# Patient Record
Sex: Female | Born: 1955 | Race: White | Hispanic: No | Marital: Married | Smoking: Never smoker
Health system: Southern US, Community
[De-identification: ages and names within clinical notes are randomized; demographics above are authoritative.]

## PROBLEM LIST (undated history)

## (undated) DIAGNOSIS — C801 Malignant (primary) neoplasm, unspecified: Secondary | ICD-10-CM

## (undated) DIAGNOSIS — K219 Gastro-esophageal reflux disease without esophagitis: Secondary | ICD-10-CM

## (undated) DIAGNOSIS — K635 Polyp of colon: Secondary | ICD-10-CM

## (undated) DIAGNOSIS — M2550 Pain in unspecified joint: Secondary | ICD-10-CM

## (undated) DIAGNOSIS — J329 Chronic sinusitis, unspecified: Secondary | ICD-10-CM

## (undated) DIAGNOSIS — F32A Depression, unspecified: Secondary | ICD-10-CM

## (undated) DIAGNOSIS — E785 Hyperlipidemia, unspecified: Secondary | ICD-10-CM

## (undated) DIAGNOSIS — F329 Major depressive disorder, single episode, unspecified: Secondary | ICD-10-CM

## (undated) DIAGNOSIS — I10 Essential (primary) hypertension: Secondary | ICD-10-CM

## (undated) DIAGNOSIS — F419 Anxiety disorder, unspecified: Secondary | ICD-10-CM

## (undated) DIAGNOSIS — C50919 Malignant neoplasm of unspecified site of unspecified female breast: Secondary | ICD-10-CM

## (undated) DIAGNOSIS — M1990 Unspecified osteoarthritis, unspecified site: Secondary | ICD-10-CM

## (undated) DIAGNOSIS — H8110 Benign paroxysmal vertigo, unspecified ear: Secondary | ICD-10-CM

## (undated) DIAGNOSIS — D131 Benign neoplasm of stomach: Secondary | ICD-10-CM

## (undated) DIAGNOSIS — R319 Hematuria, unspecified: Secondary | ICD-10-CM

## (undated) HISTORY — DX: Hematuria, unspecified: R31.9

## (undated) HISTORY — DX: Depression, unspecified: F32.A

## (undated) HISTORY — DX: Pain in unspecified joint: M25.50

## (undated) HISTORY — DX: Gastro-esophageal reflux disease without esophagitis: K21.9

## (undated) HISTORY — DX: Hyperlipidemia, unspecified: E78.5

## (undated) HISTORY — PX: EYE SURGERY: SHX253

## (undated) HISTORY — DX: Malignant (primary) neoplasm, unspecified: C80.1

## (undated) HISTORY — PX: CERVICAL POLYPECTOMY: SHX88

## (undated) HISTORY — DX: Anxiety disorder, unspecified: F41.9

## (undated) HISTORY — DX: Major depressive disorder, single episode, unspecified: F32.9

## (undated) HISTORY — PX: DILATION AND CURETTAGE OF UTERUS: SHX78

---

## 1997-06-06 HISTORY — PX: OTHER SURGICAL HISTORY: SHX169

## 2006-06-06 HISTORY — PX: ESOPHAGEAL DILATION: SHX303

## 2006-06-06 HISTORY — PX: ABLATION: SHX5711

## 2007-06-07 HISTORY — PX: FRONTAL SINUSOTOMY: SUR1296

## 2007-06-07 HISTORY — PX: CARPAL TUNNEL RELEASE: SHX101

## 2011-04-24 ENCOUNTER — Ambulatory Visit: Payer: Self-pay

## 2011-05-18 ENCOUNTER — Ambulatory Visit: Payer: Self-pay | Admitting: Internal Medicine

## 2012-01-09 ENCOUNTER — Ambulatory Visit: Payer: Self-pay | Admitting: Gastroenterology

## 2012-02-24 ENCOUNTER — Ambulatory Visit: Payer: Self-pay | Admitting: Gastroenterology

## 2012-02-27 LAB — PATHOLOGY REPORT

## 2012-06-06 HISTORY — PX: CATARACT EXTRACTION: SUR2

## 2012-06-06 HISTORY — PX: OTHER SURGICAL HISTORY: SHX169

## 2012-12-19 ENCOUNTER — Ambulatory Visit: Payer: Self-pay | Admitting: Ophthalmology

## 2013-01-23 ENCOUNTER — Ambulatory Visit: Payer: Self-pay | Admitting: Ophthalmology

## 2013-04-10 ENCOUNTER — Ambulatory Visit: Payer: Self-pay | Admitting: Internal Medicine

## 2013-09-16 ENCOUNTER — Ambulatory Visit: Payer: Self-pay | Admitting: Internal Medicine

## 2014-04-16 ENCOUNTER — Ambulatory Visit: Payer: Self-pay | Admitting: Internal Medicine

## 2014-05-02 ENCOUNTER — Emergency Department: Payer: Self-pay | Admitting: Emergency Medicine

## 2014-06-09 ENCOUNTER — Ambulatory Visit: Payer: Self-pay | Admitting: Family Medicine

## 2014-07-28 ENCOUNTER — Ambulatory Visit: Payer: Self-pay | Admitting: Family Medicine

## 2014-08-04 DIAGNOSIS — R739 Hyperglycemia, unspecified: Secondary | ICD-10-CM | POA: Insufficient documentation

## 2014-12-16 ENCOUNTER — Other Ambulatory Visit: Payer: Self-pay | Admitting: Family Medicine

## 2014-12-16 DIAGNOSIS — R3129 Other microscopic hematuria: Secondary | ICD-10-CM

## 2014-12-25 ENCOUNTER — Ambulatory Visit
Admission: RE | Admit: 2014-12-25 | Discharge: 2014-12-25 | Disposition: A | Discharge status: Home / Self Care | Payer: Managed Care, Other (non HMO) | Source: Ambulatory Visit | Attending: Family Medicine | Admitting: Family Medicine

## 2014-12-25 DIAGNOSIS — R312 Other microscopic hematuria: Secondary | ICD-10-CM | POA: Diagnosis not present

## 2014-12-25 DIAGNOSIS — R3129 Other microscopic hematuria: Secondary | ICD-10-CM

## 2014-12-25 DIAGNOSIS — D259 Leiomyoma of uterus, unspecified: Secondary | ICD-10-CM | POA: Insufficient documentation

## 2014-12-25 DIAGNOSIS — Z975 Presence of (intrauterine) contraceptive device: Secondary | ICD-10-CM | POA: Insufficient documentation

## 2014-12-25 HISTORY — DX: Essential (primary) hypertension: I10

## 2014-12-25 MED ORDER — IOHEXOL 300 MG/ML  SOLN
125.0000 mL | Freq: Once | INTRAMUSCULAR | Status: AC | PRN
  Administered 2014-12-25: 125 mL via INTRAVENOUS

## 2015-01-02 ENCOUNTER — Ambulatory Visit (INDEPENDENT_AMBULATORY_CARE_PROVIDER_SITE_OTHER): Payer: Managed Care, Other (non HMO) | Admitting: Urology

## 2015-01-02 VITALS — BP 149/81 | HR 86 | Temp 98.5°F | Resp 16 | Ht 64.0 in | Wt 275.0 lb

## 2015-01-02 DIAGNOSIS — R312 Other microscopic hematuria: Secondary | ICD-10-CM

## 2015-01-02 DIAGNOSIS — R3129 Other microscopic hematuria: Secondary | ICD-10-CM | POA: Insufficient documentation

## 2015-01-02 NOTE — Progress Notes (Signed)
H&P  Chief Complaint: Microscopic hematuria  History of Present Illness: 59 year old female referred for microscopic hematuria. Urinalysis revealed 8 red blood cells per high-powered field culture 1-10,000 strep. She denies any history of gross hematuria dysuria or UTI. She has no lower urinary tract symptoms. She has no exposure risk.  She has a history of hypertension, hyperlipidemia with borderline diabetes and chronic kidney disease.   Past Medical History  Diagnosis Date  . Hypertension   . GERD (gastroesophageal reflux disease)   . Hematuria   . Hyperlipidemia   . Depression     anxiety  . Anxiety   . Arthralgia   . Cancer     basal cell   Past Surgical History  Procedure Laterality Date  . Cataract extraction Bilateral 2014  . Ablation  2008  . Carpal tunnel release  2009  . Cervical polypectomy      Home Medications:   (Not in a hospital admission) Allergies:  Allergies  Allergen Reactions  . Penicillins Anaphylaxis  . Amlodipine Other (See Comments)    Stiff joints  . Lisinopril Other (See Comments)    Stiff joints  . Sulfa Antibiotics Other (See Comments)    Hypotention  . Valsartan Other (See Comments)    Stiff joints    No family history on file. Social History:  reports that she has never smoked. She has never used smokeless tobacco. She reports that she drinks alcohol. She reports that she does not use illicit drugs.  ROS: A complete review of systems was performed.  All systems are negative except for pertinent findings as noted. ROS positive for heartburn, fever, night sweats, fatigue, sinus problems, easy bruising, leg swelling, polydipsia, joint pain, anxiety  Physical Exam:  Vital signs in last 24 hours: @VSRANGES @ General:  Alert and oriented, No acute distress HEENT: Normocephalic, atraumatic Neck: No JVD or lymphadenopathy Cardiovascular: Regular rate and rhythm Lungs: Regular rate and effort Abdomen: Soft, nontender, nondistended,  no abdominal masses Back: No CVA tenderness Extremities: No edema Neurologic: Grossly intact  Laboratory Data:  No results found for this or any previous visit (from the past 24 hour(s)). No results found for this or any previous visit (from the past 240 hour(s)). Creatinine: No results for input(s): CREATININE in the last 168 hours.  Impression/Assessment:  MH - discussed benign versus malignant etiologies but in most cases benign. I did recommend complete evaluation with pelvic exam and cystoscopy. We discussed the nature risk and benefits of this procedure. She elects to proceed. We discussed after she has a low risk of cystoscopy being benign but the risk of significant pain, infection or bleeding with a cystoscopy is minimal but the benefit can be significant if a lesion is found.  Renal cyst-1 benign cyst,1 lesion too small to characterize. Consider renal ultrasound in 6-12 months.  Plan:  -Pelvic exam, cystoscopy  -consider renal u/s in 6 - 12 months    Carrie Whitney 01/02/2015, 1:48 PM

## 2015-01-03 LAB — URINALYSIS, COMPLETE
Bilirubin, UA: NEGATIVE
Glucose, UA: NEGATIVE
KETONES UA: NEGATIVE
LEUKOCYTES UA: NEGATIVE
Nitrite, UA: NEGATIVE
Protein, UA: NEGATIVE
RBC UA: NEGATIVE
Specific Gravity, UA: 1.01 (ref 1.005–1.030)
Urobilinogen, Ur: 0.2 mg/dL (ref 0.2–1.0)
pH, UA: 5.5 (ref 5.0–7.5)

## 2015-01-03 LAB — MICROSCOPIC EXAMINATION: Renal Epithel, UA: NONE SEEN /hpf

## 2015-01-05 LAB — CULTURE, URINE COMPREHENSIVE

## 2015-02-02 ENCOUNTER — Other Ambulatory Visit: Payer: Managed Care, Other (non HMO)

## 2015-02-17 ENCOUNTER — Ambulatory Visit (INDEPENDENT_AMBULATORY_CARE_PROVIDER_SITE_OTHER): Payer: Managed Care, Other (non HMO) | Admitting: Urology

## 2015-02-17 VITALS — BP 123/81 | HR 108 | Ht 64.0 in | Wt 241.2 lb

## 2015-02-17 DIAGNOSIS — R3129 Other microscopic hematuria: Secondary | ICD-10-CM

## 2015-02-17 DIAGNOSIS — F419 Anxiety disorder, unspecified: Secondary | ICD-10-CM

## 2015-02-17 DIAGNOSIS — H8110 Benign paroxysmal vertigo, unspecified ear: Secondary | ICD-10-CM | POA: Insufficient documentation

## 2015-02-17 DIAGNOSIS — K219 Gastro-esophageal reflux disease without esophagitis: Secondary | ICD-10-CM | POA: Insufficient documentation

## 2015-02-17 DIAGNOSIS — J329 Chronic sinusitis, unspecified: Secondary | ICD-10-CM | POA: Insufficient documentation

## 2015-02-17 DIAGNOSIS — K317 Polyp of stomach and duodenum: Secondary | ICD-10-CM | POA: Insufficient documentation

## 2015-02-17 DIAGNOSIS — E785 Hyperlipidemia, unspecified: Secondary | ICD-10-CM | POA: Insufficient documentation

## 2015-02-17 DIAGNOSIS — R312 Other microscopic hematuria: Secondary | ICD-10-CM | POA: Diagnosis not present

## 2015-02-17 DIAGNOSIS — Z85828 Personal history of other malignant neoplasm of skin: Secondary | ICD-10-CM | POA: Insufficient documentation

## 2015-02-17 DIAGNOSIS — F32A Depression, unspecified: Secondary | ICD-10-CM | POA: Insufficient documentation

## 2015-02-17 DIAGNOSIS — I10 Essential (primary) hypertension: Secondary | ICD-10-CM | POA: Insufficient documentation

## 2015-02-17 DIAGNOSIS — F329 Major depressive disorder, single episode, unspecified: Secondary | ICD-10-CM | POA: Insufficient documentation

## 2015-02-17 LAB — URINALYSIS, COMPLETE
Bilirubin, UA: NEGATIVE
Glucose, UA: NEGATIVE
Ketones, UA: NEGATIVE
Leukocytes, UA: NEGATIVE
NITRITE UA: NEGATIVE
PH UA: 5.5 (ref 5.0–7.5)
Specific Gravity, UA: 1.025 (ref 1.005–1.030)
UUROB: 0.2 mg/dL (ref 0.2–1.0)

## 2015-02-17 LAB — MICROSCOPIC EXAMINATION

## 2015-02-17 MED ORDER — CIPROFLOXACIN HCL 500 MG PO TABS
500.0000 mg | ORAL_TABLET | Freq: Once | ORAL | Status: AC
  Administered 2015-02-17: 500 mg via ORAL

## 2015-02-17 MED ORDER — LIDOCAINE HCL 2 % EX GEL
1.0000 "application " | Freq: Once | CUTANEOUS | Status: AC
  Administered 2015-02-17: 1 via URETHRAL

## 2015-02-17 NOTE — Progress Notes (Signed)
    Cystoscopy Procedure Note  Patient identification was confirmed, informed consent was obtained, and patient was prepped using Betadine solution.  Lidocaine jelly was administered per urethral meatus.    Preoperative abx where received prior to procedure.    Procedure: - Flexible cystoscope introduced, without any difficulty.   - Thorough search of the bladder revealed:    normal urethral meatus    normal urothelium    no stones    no ulcers     no tumors    no urethral polyps    no trabeculation  - Ureteral orifices were normal in position and appearance.  Post-Procedure: - Patient tolerated the procedure well   

## 2015-02-25 ENCOUNTER — Other Ambulatory Visit: Payer: Self-pay | Admitting: Gastroenterology

## 2015-02-25 DIAGNOSIS — R1310 Dysphagia, unspecified: Secondary | ICD-10-CM

## 2015-03-02 ENCOUNTER — Ambulatory Visit
Admission: RE | Admit: 2015-03-02 | Discharge: 2015-03-02 | Disposition: A | Discharge status: Home / Self Care | Payer: Managed Care, Other (non HMO) | Source: Ambulatory Visit | Attending: Gastroenterology | Admitting: Gastroenterology

## 2015-03-02 DIAGNOSIS — R1310 Dysphagia, unspecified: Secondary | ICD-10-CM | POA: Diagnosis present

## 2015-03-02 DIAGNOSIS — K219 Gastro-esophageal reflux disease without esophagitis: Secondary | ICD-10-CM | POA: Insufficient documentation

## 2015-04-16 ENCOUNTER — Encounter: Payer: Self-pay | Admitting: *Deleted

## 2015-04-17 ENCOUNTER — Ambulatory Visit: Payer: Managed Care, Other (non HMO) | Admitting: Anesthesiology

## 2015-04-17 ENCOUNTER — Ambulatory Visit
Admission: RE | Admit: 2015-04-17 | Discharge: 2015-04-17 | Disposition: A | Discharge status: Home / Self Care | Payer: Managed Care, Other (non HMO) | Source: Ambulatory Visit | Attending: Gastroenterology | Admitting: Gastroenterology

## 2015-04-17 ENCOUNTER — Encounter
Admission: RE | Disposition: A | Discharge status: Home / Self Care | Payer: Self-pay | Source: Ambulatory Visit | Attending: Gastroenterology

## 2015-04-17 DIAGNOSIS — M2550 Pain in unspecified joint: Secondary | ICD-10-CM | POA: Diagnosis not present

## 2015-04-17 DIAGNOSIS — H8110 Benign paroxysmal vertigo, unspecified ear: Secondary | ICD-10-CM | POA: Diagnosis not present

## 2015-04-17 DIAGNOSIS — K219 Gastro-esophageal reflux disease without esophagitis: Secondary | ICD-10-CM | POA: Diagnosis not present

## 2015-04-17 DIAGNOSIS — Z880 Allergy status to penicillin: Secondary | ICD-10-CM | POA: Insufficient documentation

## 2015-04-17 DIAGNOSIS — Z6841 Body Mass Index (BMI) 40.0 and over, adult: Secondary | ICD-10-CM | POA: Insufficient documentation

## 2015-04-17 DIAGNOSIS — K449 Diaphragmatic hernia without obstruction or gangrene: Secondary | ICD-10-CM | POA: Insufficient documentation

## 2015-04-17 DIAGNOSIS — E785 Hyperlipidemia, unspecified: Secondary | ICD-10-CM | POA: Diagnosis not present

## 2015-04-17 DIAGNOSIS — I10 Essential (primary) hypertension: Secondary | ICD-10-CM | POA: Insufficient documentation

## 2015-04-17 DIAGNOSIS — R319 Hematuria, unspecified: Secondary | ICD-10-CM | POA: Insufficient documentation

## 2015-04-17 DIAGNOSIS — Z888 Allergy status to other drugs, medicaments and biological substances status: Secondary | ICD-10-CM | POA: Diagnosis not present

## 2015-04-17 DIAGNOSIS — K317 Polyp of stomach and duodenum: Secondary | ICD-10-CM | POA: Insufficient documentation

## 2015-04-17 DIAGNOSIS — D131 Benign neoplasm of stomach: Secondary | ICD-10-CM

## 2015-04-17 DIAGNOSIS — R1310 Dysphagia, unspecified: Secondary | ICD-10-CM | POA: Diagnosis present

## 2015-04-17 DIAGNOSIS — Z882 Allergy status to sulfonamides status: Secondary | ICD-10-CM | POA: Diagnosis not present

## 2015-04-17 DIAGNOSIS — F329 Major depressive disorder, single episode, unspecified: Secondary | ICD-10-CM | POA: Diagnosis not present

## 2015-04-17 DIAGNOSIS — J329 Chronic sinusitis, unspecified: Secondary | ICD-10-CM | POA: Insufficient documentation

## 2015-04-17 DIAGNOSIS — Z85828 Personal history of other malignant neoplasm of skin: Secondary | ICD-10-CM | POA: Diagnosis not present

## 2015-04-17 DIAGNOSIS — R1013 Epigastric pain: Secondary | ICD-10-CM | POA: Insufficient documentation

## 2015-04-17 DIAGNOSIS — F419 Anxiety disorder, unspecified: Secondary | ICD-10-CM | POA: Diagnosis not present

## 2015-04-17 DIAGNOSIS — Z79899 Other long term (current) drug therapy: Secondary | ICD-10-CM | POA: Insufficient documentation

## 2015-04-17 DIAGNOSIS — K2970 Gastritis, unspecified, without bleeding: Secondary | ICD-10-CM | POA: Insufficient documentation

## 2015-04-17 HISTORY — DX: Benign paroxysmal vertigo, unspecified ear: H81.10

## 2015-04-17 HISTORY — DX: Unspecified osteoarthritis, unspecified site: M19.90

## 2015-04-17 HISTORY — DX: Benign neoplasm of stomach: D13.1

## 2015-04-17 HISTORY — DX: Chronic sinusitis, unspecified: J32.9

## 2015-04-17 HISTORY — PX: ESOPHAGOGASTRODUODENOSCOPY (EGD) WITH PROPOFOL: SHX5813

## 2015-04-17 SURGERY — ESOPHAGOGASTRODUODENOSCOPY (EGD) WITH PROPOFOL
Anesthesia: General

## 2015-04-17 MED ORDER — PROPOFOL 10 MG/ML IV BOLUS
INTRAVENOUS | Status: DC | PRN
  Administered 2015-04-17 (×2): 50 mg via INTRAVENOUS

## 2015-04-17 MED ORDER — PROPOFOL 500 MG/50ML IV EMUL
INTRAVENOUS | Status: DC | PRN
  Administered 2015-04-17: 120 ug/kg/min via INTRAVENOUS

## 2015-04-17 MED ORDER — SODIUM CHLORIDE 0.9 % IV SOLN
INTRAVENOUS | Status: DC
  Administered 2015-04-17: 08:00:00 via INTRAVENOUS

## 2015-04-17 NOTE — H&P (Signed)
Outpatient short stay form Pre-procedure 04/17/2015 8:17 AM Carrie Sails MD  Primary Physician: Dr Blenda Mounts  Reason for visit:  EGD  History of present illness:  Patient is a 59 year old female presenting today with complaints of increasing reflux and dyspepsia. Some occasional dysphagia barium swallow study showed no evidence of a fixed stricture. There is evidence of some mild dysmotility. Recently had a prescription refill with a different patient and feels this is not working as well as the previous. She takes no aspirin or blood thinning medications.    Current facility-administered medications:  .  0.9 %  sodium chloride infusion, , Intravenous, Continuous, Carrie Sails, MD, Last Rate: 20 mL/hr at 04/17/15 0751 .  0.9 %  sodium chloride infusion, , Intravenous, Continuous, Carrie Sails, MD  Prescriptions prior to admission  Medication Sig Dispense Refill Last Dose  . nebivolol (BYSTOLIC) 5 MG tablet Take 5 mg by mouth.   04/17/2015 at 0600  . atorvastatin (LIPITOR) 40 MG tablet Take by mouth.   Completed Course at Unknown time  . lansoprazole (PREVACID) 30 MG capsule Take 30 mg by mouth.   Taking  . levonorgestrel (MIRENA) 20 MCG/24HR IUD by Intrauterine route.   Completed Course at Unknown time  . venlafaxine XR (EFFEXOR XR) 150 MG 24 hr capsule Take by mouth.   Completed Course at Unknown time     Allergies  Allergen Reactions  . Penicillins Anaphylaxis  . Amlodipine Other (See Comments)    Stiff joints  . Lisinopril Other (See Comments)    Stiff joints  . Sulfa Antibiotics Other (See Comments)    Hypotention  . Valsartan Other (See Comments)    Stiff joints     Past Medical History  Diagnosis Date  . Hypertension   . GERD (gastroesophageal reflux disease)   . Hematuria   . Hyperlipidemia   . Depression     anxiety  . Anxiety   . Arthralgia   . Cancer ([place])     basal cell  . Arthritis   . BPPV (benign paroxysmal positional vertigo)    . Sinusitis, chronic     Review of systems:      Physical Exam    Heart and lungs: Regular rate and rhythm without rub or gallop, lungs are bilaterally clear    HEENT: Normocephalic atraumatic eyes are anicteric    Other:     Pertinant exam for procedure: Soft nontender nondistended bowel sounds positive normoactive    Planned proceedures: EGD and indicated procedures I have discussed the risks benefits and complications of procedures to include not limited to bleeding, infection, perforation and the risk of sedation and the patient wishes to proceed.    Carrie Sails, MD Gastroenterology 04/17/2015  8:17 AM

## 2015-04-17 NOTE — Transfer of Care (Signed)
Immediate Anesthesia Transfer of Care Note  Patient: Carrie Whitney  Procedure(s) Performed: Procedure(s): ESOPHAGOGASTRODUODENOSCOPY (EGD) WITH PROPOFOL (N/A)  Patient Location: PACU  Anesthesia Type:General  Level of Consciousness: awake, alert  and oriented  Airway & Oxygen Therapy: Patient Spontanous Breathing and Patient connected to nasal cannula oxygen  Post-op Assessment: Report given to RN and Post -op Vital signs reviewed and stable  Post vital signs: stable  Last Vitals:  Filed Vitals:   04/17/15 0746  BP: 148/75  Pulse: 64  Temp: 37.6 C  Resp: 21    Complications: No apparent anesthesia complications

## 2015-04-17 NOTE — Op Note (Signed)
[facility] Gastroenterology Patient Name: Carrie Whitney Procedure Date: 04/17/2015 7:54 AM MRN: [id number] Account #: [id number] Date of Birth: [birth year] Admit Type: Outpatient Age: 59 Room: [facility] ENDO ROOM 3 Gender: Female Note Status: Finalized Procedure:         Upper GI endoscopy Indications:       Dyspepsia, Dysphagia, Follow-up of gastric polyps Providers:         Lollie Sails, MD Medicines:         Monitored Anesthesia Care Complications:     No immediate complications. Procedure:         Pre-Anesthesia Assessment:                    - ASA Grade Assessment: III - A patient with severe                     systemic disease.                    After obtaining informed consent, the endoscope was passed                     under direct vision. Throughout the procedure, the                     patient's blood pressure, pulse, and oxygen saturations                     were monitored continuously. The Endoscope was introduced                     through the mouth, and advanced to the third part of                     duodenum. The upper GI endoscopy was unusually difficult.                     Successful completion of the procedure was aided by                     changing the patient's position. The patient tolerated the                     procedure well. Findings:      The Z-line was variable. Biopsies were taken with a cold forceps for       histology.      The exam of the esophagus was otherwise normal.      Multiple 1 to 5 mm sessile polyps with no bleeding and no stigmata of       recent bleeding were found in the cardia, in the gastric body and in the       entire examined stomach. Biopsies were taken with a cold forceps for       histology.      Patchy minimal inflammation characterized by erythema was found in the       gastric body. Biopsies were taken with a cold forceps for histology.       Biopsies were taken with a cold forceps for  Helicobacter pylori testing.      A single 5 mm pedunculated polyp with no bleeding and stigmata of recent       bleeding/erosion was found in the cardia. +I attenpted to approach this       polyp multiple timed from both a  retroflexed and forward view and was       unsuccessfur in gettting access for removal. I was able to get a single       biopsy in forward vieer with some difficulty again in alignment.       hemostasis was good.      A small hiatus hernia was present. When she hiccoughed, the polyp in the       cardia would come above the diaphragmatic hiatus only briefly.      The examined duodenum was normal. Impression:        - Z-line variable. Biopsied.                    - Multiple gastric polyps. Biopsied.                    - Gastritis. Biopsied.                    - A single gastric polyp.                    - Small hiatus hernia.                    - Normal examined duodenum. Recommendation:    - Await pathology results.                    - Use Protonix (pantoprazole) 40 mg PO BID for 4 weeks.                    - Use Protonix (pantoprazole) 40 mg PO daily daily. Procedure Code(s): --- Professional ---                    [phone], Esophagogastroduodenoscopy, flexible, transoral;                     with biopsy, single or multiple CPT copyright 2014 American Medical Association. All rights reserved. The codes documented in this report are preliminary and upon coder review may  be revised to meet current compliance requirements. Lollie Sails, MD 04/17/2015 9:18:00 AM This report has been signed electronically. Number of Addenda: 0 Note Initiated On: 04/17/2015 7:54 AM      [facility]

## 2015-04-17 NOTE — Anesthesia Postprocedure Evaluation (Signed)
  Anesthesia Post-op Note  Patient: Carrie Whitney  Procedure(s) Performed: Procedure(s): ESOPHAGOGASTRODUODENOSCOPY (EGD) WITH PROPOFOL (N/A)  Anesthesia type:General  Patient location: PACU  Post pain: Pain level controlled  Post assessment: Post-op Vital signs reviewed, Patient's Cardiovascular Status Stable, Respiratory Function Stable, Patent Airway and No signs of Nausea or vomiting  Post vital signs: Reviewed and stable  Last Vitals:  Filed Vitals:   04/17/15 0746  BP: 148/75  Pulse: 64  Temp: 37.6 C  Resp: 21    Level of consciousness: awake, alert  and patient cooperative  Complications: No apparent anesthesia complications

## 2015-04-17 NOTE — Anesthesia Preprocedure Evaluation (Signed)
Anesthesia Evaluation  Patient identified by MRN, date of birth, ID band Patient awake    Reviewed: Allergy & Precautions, NPO status , Patient's Chart, lab work & pertinent test results  Airway Mallampati: III       Dental  (+) Teeth Intact   Pulmonary    + rhonchi        Cardiovascular hypertension, Pt. on home beta blockers Normal cardiovascular exam     Neuro/Psych    GI/Hepatic Neg liver ROS, GERD  ,  Endo/Other  negative endocrine ROSMorbid obesity  Renal/GU      Musculoskeletal   Abdominal (+) + obese,   Peds  Hematology   Anesthesia Other Findings   Reproductive/Obstetrics                             Anesthesia Physical Anesthesia Plan  ASA: III  Anesthesia Plan: General   Post-op Pain Management:    Induction: Intravenous  Airway Management Planned: Nasal Cannula  Additional Equipment:   Intra-op Plan:   Post-operative Plan:   Informed Consent: I have reviewed the patients History and Physical, chart, labs and discussed the procedure including the risks, benefits and alternatives for the proposed anesthesia with the patient or authorized representative who has indicated his/her understanding and acceptance.     Plan Discussed with: CRNA  Anesthesia Plan Comments:         Anesthesia Quick Evaluation

## 2015-04-20 LAB — SURGICAL PATHOLOGY

## 2015-04-22 ENCOUNTER — Encounter: Payer: Self-pay | Admitting: Gastroenterology

## 2015-06-09 ENCOUNTER — Encounter: Payer: Self-pay | Admitting: *Deleted

## 2015-06-10 ENCOUNTER — Encounter: Admission: RE | Payer: Self-pay | Source: Ambulatory Visit

## 2015-06-10 ENCOUNTER — Ambulatory Visit
Admission: RE | Admit: 2015-06-10 | Payer: Managed Care, Other (non HMO) | Source: Ambulatory Visit | Admitting: Gastroenterology

## 2015-06-10 HISTORY — DX: Benign neoplasm of stomach: D13.1

## 2015-06-10 SURGERY — MANOMETRY, ESOPHAGUS

## 2015-07-16 ENCOUNTER — Encounter: Payer: Self-pay | Admitting: *Deleted

## 2015-07-17 ENCOUNTER — Ambulatory Visit
Admission: RE | Admit: 2015-07-17 | Discharge: 2015-07-17 | Disposition: A | Discharge status: Home / Self Care | Payer: Managed Care, Other (non HMO) | Source: Ambulatory Visit | Attending: Gastroenterology | Admitting: Gastroenterology

## 2015-07-17 ENCOUNTER — Ambulatory Visit: Payer: Managed Care, Other (non HMO) | Admitting: Anesthesiology

## 2015-07-17 ENCOUNTER — Encounter
Admission: RE | Disposition: A | Discharge status: Home / Self Care | Payer: Self-pay | Source: Ambulatory Visit | Attending: Gastroenterology

## 2015-07-17 ENCOUNTER — Encounter: Payer: Self-pay | Admitting: *Deleted

## 2015-07-17 DIAGNOSIS — K2960 Other gastritis without bleeding: Secondary | ICD-10-CM | POA: Insufficient documentation

## 2015-07-17 DIAGNOSIS — Z882 Allergy status to sulfonamides status: Secondary | ICD-10-CM | POA: Diagnosis not present

## 2015-07-17 DIAGNOSIS — Z880 Allergy status to penicillin: Secondary | ICD-10-CM | POA: Insufficient documentation

## 2015-07-17 DIAGNOSIS — Z86010 Personal history of colonic polyps: Secondary | ICD-10-CM | POA: Insufficient documentation

## 2015-07-17 DIAGNOSIS — F329 Major depressive disorder, single episode, unspecified: Secondary | ICD-10-CM | POA: Insufficient documentation

## 2015-07-17 DIAGNOSIS — K317 Polyp of stomach and duodenum: Secondary | ICD-10-CM | POA: Diagnosis not present

## 2015-07-17 DIAGNOSIS — Z888 Allergy status to other drugs, medicaments and biological substances status: Secondary | ICD-10-CM | POA: Insufficient documentation

## 2015-07-17 DIAGNOSIS — M1990 Unspecified osteoarthritis, unspecified site: Secondary | ICD-10-CM | POA: Diagnosis not present

## 2015-07-17 DIAGNOSIS — F419 Anxiety disorder, unspecified: Secondary | ICD-10-CM | POA: Insufficient documentation

## 2015-07-17 DIAGNOSIS — K219 Gastro-esophageal reflux disease without esophagitis: Secondary | ICD-10-CM | POA: Insufficient documentation

## 2015-07-17 DIAGNOSIS — Z79899 Other long term (current) drug therapy: Secondary | ICD-10-CM | POA: Insufficient documentation

## 2015-07-17 DIAGNOSIS — I10 Essential (primary) hypertension: Secondary | ICD-10-CM | POA: Diagnosis not present

## 2015-07-17 DIAGNOSIS — E785 Hyperlipidemia, unspecified: Secondary | ICD-10-CM | POA: Insufficient documentation

## 2015-07-17 DIAGNOSIS — K224 Dyskinesia of esophagus: Secondary | ICD-10-CM | POA: Diagnosis not present

## 2015-07-17 HISTORY — PX: ESOPHAGOGASTRODUODENOSCOPY (EGD) WITH PROPOFOL: SHX5813

## 2015-07-17 HISTORY — DX: Polyp of colon: K63.5

## 2015-07-17 SURGERY — ESOPHAGOGASTRODUODENOSCOPY (EGD) WITH PROPOFOL
Anesthesia: General

## 2015-07-17 MED ORDER — FENTANYL CITRATE (PF) 100 MCG/2ML IJ SOLN
INTRAMUSCULAR | Status: DC | PRN
  Administered 2015-07-17: 50 ug via INTRAVENOUS

## 2015-07-17 MED ORDER — SODIUM CHLORIDE 0.9 % IV SOLN: INTRAVENOUS | Status: DC

## 2015-07-17 MED ORDER — PROPOFOL 10 MG/ML IV BOLUS
INTRAVENOUS | Status: DC | PRN
  Administered 2015-07-17 (×8): 20 mg via INTRAVENOUS
  Administered 2015-07-17: 50 mg via INTRAVENOUS
  Administered 2015-07-17 (×2): 20 mg via INTRAVENOUS
  Administered 2015-07-17: 50 mg via INTRAVENOUS
  Administered 2015-07-17 (×4): 20 mg via INTRAVENOUS

## 2015-07-17 MED ORDER — GLYCOPYRROLATE 0.2 MG/ML IJ SOLN
INTRAMUSCULAR | Status: DC | PRN
  Administered 2015-07-17: 0.2 mg via INTRAVENOUS

## 2015-07-17 MED ORDER — SODIUM CHLORIDE 0.9 % IV SOLN
INTRAVENOUS | Status: DC
  Administered 2015-07-17: 07:00:00 via INTRAVENOUS

## 2015-07-17 NOTE — Anesthesia Postprocedure Evaluation (Signed)
Anesthesia Post Note  Patient: Carrie Whitney  Procedure(s) Performed: Procedure(s) (LRB): ESOPHAGOGASTRODUODENOSCOPY (EGD) WITH PROPOFOL (N/A)  Patient location during evaluation: PACU Anesthesia Type: General Level of consciousness: awake and alert Pain management: pain level controlled Vital Signs Assessment: post-procedure vital signs reviewed and stable Respiratory status: spontaneous breathing, nonlabored ventilation, respiratory function stable and patient connected to nasal cannula oxygen Cardiovascular status: blood pressure returned to baseline and stable Postop Assessment: no signs of nausea or vomiting Anesthetic complications: no    Last Vitals:  Filed Vitals:   07/17/15 0850 07/17/15 0900  BP: 131/82 127/66  Pulse: 64 58  Temp:    Resp: 17 18    Last Pain: There were no vitals filed for this visit.               Molli Barrows

## 2015-07-17 NOTE — Anesthesia Preprocedure Evaluation (Signed)
Anesthesia Evaluation  Patient identified by MRN, date of birth, ID band Patient awake    Reviewed: Allergy & Precautions, H&P , NPO status , Patient's Chart, lab work & pertinent test results, reviewed documented beta blocker date and time   Airway Mallampati: III   Neck ROM: full    Dental  (+) Poor Dentition   Pulmonary neg pulmonary ROS,    Pulmonary exam normal        Cardiovascular hypertension, negative cardio ROS Normal cardiovascular exam     Neuro/Psych PSYCHIATRIC DISORDERS negative neurological ROS  negative psych ROS   GI/Hepatic negative GI ROS, Neg liver ROS, GERD  ,  Endo/Other  negative endocrine ROSMorbid obesity  Renal/GU negative Renal ROS  negative genitourinary   Musculoskeletal   Abdominal   Peds  Hematology negative hematology ROS (+)   Anesthesia Other Findings Past Medical History:   Hypertension                                                 GERD (gastroesophageal reflux disease)                       Hematuria                                                    Hyperlipidemia                                               Depression                                                     Comment:anxiety   Anxiety                                                      Arthralgia                                                   Cancer (HCC)                                                   Comment:basal cell   Arthritis                                                    BPPV (benign paroxysmal positional vertigo)  Sinusitis, chronic                                           Fundic gland polyps of stomach, benign          04/17/2015   Colon polyp                                                Past Surgical History:   CATARACT EXTRACTION                             Bilateral 2014         ABLATION                                         2008         CARPAL TUNNEL RELEASE                             2009         CERVICAL POLYPECTOMY                                          FRONTAL SINUSOTOMY                              Right 2009         ethmoidectomy intranasal                         1999         DILATION AND CURETTAGE OF UTERUS                              EYE SURGERY                                                   ESOPHAGOGASTRODUODENOSCOPY (EGD) WITH PROPOFOL  N/A 04/17/2015     Comment:Procedure: ESOPHAGOGASTRODUODENOSCOPY (EGD)               WITH PROPOFOL;  Surgeon: Lollie Sails, MD;              Location: [facility] ENDOSCOPY;  Service: Endoscopy;               Laterality: N/A;   ESOPHAGEAL DILATION                              2008         HEAD & NECK SKIN LEISON BIOPSY                  Bilateral 2014         DILATION AND CURETTAGE  OF UTERUS                              Reproductive/Obstetrics                             Anesthesia Physical Anesthesia Plan  ASA: III  Anesthesia Plan: General   Post-op Pain Management:    Induction:   Airway Management Planned:   Additional Equipment:   Intra-op Plan:   Post-operative Plan:   Informed Consent: I have reviewed the patients History and Physical, chart, labs and discussed the procedure including the risks, benefits and alternatives for the proposed anesthesia with the patient or authorized representative who has indicated his/her understanding and acceptance.   Dental Advisory Given  Plan Discussed with: CRNA  Anesthesia Plan Comments:         Anesthesia Quick Evaluation

## 2015-07-17 NOTE — Transfer of Care (Signed)
Immediate Anesthesia Transfer of Care Note  Patient: Carrie Whitney  Procedure(s) Performed: Procedure(s): ESOPHAGOGASTRODUODENOSCOPY (EGD) WITH PROPOFOL (N/A)  Patient Location: PACU  Anesthesia Type:General  Level of Consciousness: awake, alert , oriented and patient cooperative  Airway & Oxygen Therapy: Patient Spontanous Breathing and Patient connected to nasal cannula oxygen  Post-op Assessment: Report given to RN, Post -op Vital signs reviewed and stable and Patient moving all extremities  Post vital signs: Reviewed and stable  Last Vitals:  Filed Vitals:   07/17/15 0710 07/17/15 0834  BP: 135/90 99/58  Pulse: 75 74  Temp: 36.9 C 36.3 C  Resp: 18 14    Complications: No apparent anesthesia complications

## 2015-07-17 NOTE — Addendum Note (Signed)
Addendum  created 07/17/15 1015 by Alda Berthold, CRNA   Modules edited: Charges VN

## 2015-07-17 NOTE — H&P (Addendum)
Outpatient short stay form Pre-procedure 07/17/2015 7:41 AM Lollie Sails MD  Primary Physician: Dr Arrie Aran  Reason for visit:  EGD  History of present illness:  Patient is a 60 year old female presenting today for repeat EGD. She had an EGD on 04/17/2015 showed multiple small fundic polyps in the gastric vault. However there is a polypoid lesion in the upper cardia. This was biopsied and found to be inflamed gastritis however the lesion was quite irritated and was on some amount of bleeding this is very difficult to get to. Placed her on higher dose PPI and Carafate last 3 months and she is repeating her scope today for further evaluation. I had wanted to arrange this done a time when Dr. Candace Cruise was available in case we needed to use a side viewer.    Current facility-administered medications:  .  0.9 %  sodium chloride infusion, , Intravenous, Continuous, Lollie Sails, MD, Last Rate: 20 mL/hr at 07/17/15 0721 .  0.9 %  sodium chloride infusion, , Intravenous, Continuous, Lollie Sails, MD  Prescriptions prior to admission  Medication Sig Dispense Refill Last Dose  . nebivolol (BYSTOLIC) 5 MG tablet Take 5 mg by mouth.   07/17/2015 at 0630  . sucralfate (CARAFATE) 1 g tablet Take 1 g by mouth 4 (four) times daily -  with meals and at bedtime.     Marland Kitchen atorvastatin (LIPITOR) 40 MG tablet Take by mouth.   Completed Course at Unknown time  . lansoprazole (PREVACID) 30 MG capsule Take 30 mg by mouth. Reported on 06/09/2015   Not Taking at Unknown time  . levonorgestrel (MIRENA) 20 MCG/24HR IUD by Intrauterine route.   Completed Course at Unknown time  . venlafaxine XR (EFFEXOR XR) 150 MG 24 hr capsule Take by mouth.   Completed Course at Unknown time     Allergies  Allergen Reactions  . Penicillins Anaphylaxis  . Amlodipine Other (See Comments)    Stiff joints  . Lisinopril Other (See Comments)    Stiff joints  . Sulfa Antibiotics Other (See Comments)    Hypotention  .  Valsartan Other (See Comments)    Stiff joints     Past Medical History  Diagnosis Date  . Hypertension   . GERD (gastroesophageal reflux disease)   . Hematuria   . Hyperlipidemia   . Depression     anxiety  . Anxiety   . Arthralgia   . Cancer ([place])     basal cell  . Arthritis   . BPPV (benign paroxysmal positional vertigo)   . Sinusitis, chronic   . Fundic gland polyps of stomach, benign 04/17/2015  . Colon polyp     Review of systems:      Physical Exam    Heart and lungs: Regular rate and rhythm without rub or gallop, lungs are bilaterally clear    HEENT: Normocephalic atraumatic eyes are anicteric    Other:     Pertinant exam for procedure: Soft nontender nondistended bowel sounds positive normoactive    Planned proceedures: EGD and indicated procedures.    Lollie Sails, MD Gastroenterology 07/17/2015  7:41 AM      I have discussed the risks benefits and complications of procedures to include not limited to bleeding, infection, perforation and the risk of sedation and the patient wishes to proceed.

## 2015-07-17 NOTE — Op Note (Signed)
[facility] Gastroenterology Patient Name: Tamyah Fiest Procedure Date: 07/17/2015 7:57 AM MRN: [id number] Account #: [id number] Date of Birth: [birth year] Admit Type: Outpatient Age: 60 Room: [facility] ENDO ROOM 3 Gender: Female Note Status: Finalized Procedure:         Upper GI endoscopy Indications:       Gastro-esophageal reflux disease, Atypical polypoid                     lesion. follow up inflamed gastric polyp Providers:         Lollie Sails, MD Referring MD:      Ardyth Man. Bobette Mo (Referring MD) Medicines:         Monitored Anesthesia Care Complications:     No immediate complications. Procedure:         Pre-Anesthesia Assessment:                    - ASA Grade Assessment: III - A patient with severe                     systemic disease.                    After obtaining informed consent, the endoscope was passed                     under direct vision. Throughout the procedure, the                     patient's blood pressure, pulse, and oxygen saturations                     were monitored continuously. The Endoscope was introduced                     through the mouth, and advanced to the second part of                     duodenum. The upper GI endoscopy was technically difficult                     and complex. Findings:      The examined esophagus was normal.      Abnormal motility was noted in the lower third of the esophagus. The       cricopharyngeus was normal. There is spasticity of the esophageal body.       Tertiary peristaltic waves are noted.      A single 9 mm semi-sessile polyp with no bleeding and stigmata of recent       bleeding was found in the cardia. The polyp was removed with a hot       snare. Resection and retrieval were complete.      A single 7 mm semi-sessile polyp with bleeding and no stigmata of recent       bleeding was found on the lesser curvature of the stomach. The polyp was       removed with a hot snare.  Resection and retrieval were complete.      Multiple 1 to 5 mm pedunculated and sessile polyps with no bleeding and       no stigmata of recent bleeding were found in the gastric body.      Patchy moderate inflammation characterized by congestion (edema),       erosions, erythema and linear erosions was  found in the gastric antrum.      Both resection sites showed good hemostasis. Impression:        - Normal esophagus.                    - Esophageal motility disorder.                    - A single gastric polyp. Resected and retrieved.                    - A single gastric polyp. Resected and retrieved.                    - Multiple gastric polyps.                    - Erosive gastritis. Recommendation:    - Use Dexilant (dexlansoprazole) 60 mg PO daily daily.                    - Await pathology results.                    - Use sucralfate tablets 1 gram PO QID daily.                    - No aspirin, ibuprofen, naproxen, or other non-steroidal                     anti-inflammatory drugs for 4 weeks.                    - Clear liquid diet today.                    - Full liquid diet for 3 weeks. Procedure Code(s): --- Professional ---                    [id number], Esophagogastroduodenoscopy, flexible, transoral;                     with removal of tumor(s), polyp(s), or other lesion(s) by                     snare technique Diagnosis Code(s): --- Professional ---                    K22.4, Dyskinesia of esophagus                    K31.7, Polyp of stomach and duodenum                    K29.60, Other gastritis without bleeding                    K21.9, Gastro-esophageal reflux disease without esophagitis CPT copyright 2014 American Medical Association. All rights reserved. The codes documented in this report are preliminary and upon coder review may  be revised to meet current compliance requirements. Lollie Sails, MD 07/17/2015 8:44:20 AM This report has been signed  electronically. Number of Addenda: 0 Note Initiated On: 07/17/2015 7:57 AM      [facility]

## 2015-07-18 ENCOUNTER — Encounter: Payer: Self-pay | Admitting: Gastroenterology

## 2015-07-20 LAB — SURGICAL PATHOLOGY

## 2015-12-14 ENCOUNTER — Encounter: Payer: Self-pay | Admitting: *Deleted

## 2015-12-14 ENCOUNTER — Other Ambulatory Visit: Payer: Managed Care, Other (non HMO)

## 2015-12-14 NOTE — Patient Instructions (Signed)
  Your procedure is scheduled on: 12-21-15 [facility]) Report to Same Day Surgery 2nd floor medical mall To find out your arrival time please call [phone] between 1PM - 3PM on 12-18-15 (FRIDAY)  Remember: Instructions that are not followed completely may result in serious medical risk, up to and including death, or upon the discretion of your surgeon and anesthesiologist your surgery may need to be rescheduled.    _x___ 1. Do not eat food or drink liquids after midnight. No gum chewing or hard candies.     __x__ 2. No Alcohol for 24 hours before or after surgery.   __x__3. No Smoking for 24 prior to surgery.   ____  4. Bring all medications with you on the day of surgery if instructed.    __x__ 5. Notify your doctor if there is any change in your medical condition     (cold, fever, infections).     Do not wear jewelry, make-up, hairpins, clips or nail polish.  Do not wear lotions, powders, or perfumes. You may wear deodorant.  Do not shave 48 hours prior to surgery. Men may shave face and neck.  Do not bring valuables to the hospital.    [facility] is not responsible for any belongings or valuables.               Contacts, dentures or bridgework may not be worn into surgery.  Leave your suitcase in the car. After surgery it may be brought to your room.  For patients admitted to the hospital, discharge time is determined by your treatment team.   Patients discharged the day of surgery will not be allowed to drive home.    Please read over the following fact sheets that you were given:   [facility] Preparing for Surgery and or MRSA Information   _x___ Take these medicines the morning of surgery with A SIP OF WATER:    1. ATORVASTATIN  2. ENALAPRIL  3.BYSTOLIC  4.EFFEXOR  5.DEXILANT  6.TAKE AN EXTRA DEXILANT ON Sunday NIGHT  ____ Fleet Enema (as directed)   _x___ Use CHG Soap or sage wipes as directed on instruction sheet   ____ Use inhalers on the day of surgery and  bring to hospital day of surgery  ____ Stop metformin 2 days prior to surgery    ____ Take 1/2 of usual insulin dose the night before surgery and none on the morning of surgery.   _X___ Stop aspirin or coumadin, or plavix-STOP ASA NOW  _x__ Stop Anti-inflammatories such as Advil, Aleve, Ibuprofen, Motrin, Naproxen,          Naprosyn, Goodies powders or aspirin products. Ok to take Tylenol.   ____ Stop supplements until after surgery.    ____ Bring C-Pap to the hospital.

## 2015-12-18 MED ORDER — PHENYLEPHRINE HCL 10 % OP SOLN
Freq: Once | OPHTHALMIC | Status: AC
  Administered 2015-12-21: 10 mL via TOPICAL
  Filled 2015-12-18: qty 10

## 2015-12-18 NOTE — Pre-Procedure Instructions (Signed)
[place]  Component Name Value Range  Vent Rate (bpm) 71   PR Interval (msec) 142   QRS Interval (msec) 90   QT Interval (msec) 424   QTc (msec) 460    Result Narrative  Normal sinus rhythm Normal ECG No previous ECGs available I reviewed and concur with this report. Electronically signed [id number], MD, NEIL ([id number]) on 12/15/2015 10:40:34 PM   Status Results Details   Encounter Summary

## 2015-12-20 NOTE — Anesthesia Preprocedure Evaluation (Addendum)
Anesthesia Evaluation  Patient identified by MRN, date of birth, ID band Patient awake    Reviewed: Allergy & Precautions, NPO status , Patient's Chart, lab work & pertinent test results, reviewed documented beta blocker date and time   History of Anesthesia Complications Negative for: history of anesthetic complications  Airway Mallampati: II  TM Distance: >3 FB Neck ROM: Full    Dental no notable dental hx.    Pulmonary neg sleep apnea (snores, but husband denies that she has episodes of apnea), neg COPD, neg recent URI,    breath sounds clear to auscultation- rhonchi (-) wheezing      Cardiovascular Exercise Tolerance: Good hypertension, Pt. on home beta blockers (-) CAD and (-) Past MI  Rhythm:Regular Rate:Normal - Systolic murmurs and - Diastolic murmurs    Neuro/Psych PSYCHIATRIC DISORDERS Anxiety Depression    GI/Hepatic Neg liver ROS, GERD  ,  Endo/Other  neg diabetes  Renal/GU Renal disease     Musculoskeletal  (+) Arthritis , Osteoarthritis,    Abdominal (+) + obese,   Peds  Hematology negative hematology ROS (+)   Anesthesia Other Findings   Reproductive/Obstetrics negative OB ROS                            Anesthesia Physical Anesthesia Plan  ASA: II  Anesthesia Plan: General   Post-op Pain Management:    Induction: Intravenous  Airway Management Planned: Oral ETT  Additional Equipment:   Intra-op Plan:   Post-operative Plan: Extubation in OR  Informed Consent: I have reviewed the patients History and Physical, chart, labs and discussed the procedure including the risks, benefits and alternatives for the proposed anesthesia with the patient or authorized representative who has indicated his/her understanding and acceptance.   Dental advisory given  Plan Discussed with: CRNA  Anesthesia Plan Comments:        Anesthesia Quick Evaluation

## 2015-12-21 ENCOUNTER — Encounter: Payer: Self-pay | Admitting: *Deleted

## 2015-12-21 ENCOUNTER — Ambulatory Visit
Admission: RE | Admit: 2015-12-21 | Discharge: 2015-12-21 | Disposition: A | Discharge status: Home / Self Care | Payer: Managed Care, Other (non HMO) | Source: Ambulatory Visit | Attending: Otolaryngology | Admitting: Otolaryngology

## 2015-12-21 ENCOUNTER — Ambulatory Visit: Payer: Managed Care, Other (non HMO) | Admitting: Anesthesiology

## 2015-12-21 ENCOUNTER — Encounter
Admission: RE | Disposition: A | Discharge status: Home / Self Care | Payer: Self-pay | Source: Ambulatory Visit | Attending: Otolaryngology

## 2015-12-21 DIAGNOSIS — Z79899 Other long term (current) drug therapy: Secondary | ICD-10-CM | POA: Diagnosis not present

## 2015-12-21 DIAGNOSIS — E669 Obesity, unspecified: Secondary | ICD-10-CM | POA: Diagnosis not present

## 2015-12-21 DIAGNOSIS — Z881 Allergy status to other antibiotic agents status: Secondary | ICD-10-CM | POA: Insufficient documentation

## 2015-12-21 DIAGNOSIS — Z8249 Family history of ischemic heart disease and other diseases of the circulatory system: Secondary | ICD-10-CM | POA: Insufficient documentation

## 2015-12-21 DIAGNOSIS — J322 Chronic ethmoidal sinusitis: Secondary | ICD-10-CM | POA: Diagnosis present

## 2015-12-21 DIAGNOSIS — J328 Other chronic sinusitis: Secondary | ICD-10-CM | POA: Diagnosis not present

## 2015-12-21 DIAGNOSIS — E785 Hyperlipidemia, unspecified: Secondary | ICD-10-CM | POA: Diagnosis not present

## 2015-12-21 DIAGNOSIS — Z882 Allergy status to sulfonamides status: Secondary | ICD-10-CM | POA: Diagnosis not present

## 2015-12-21 DIAGNOSIS — E7800 Pure hypercholesterolemia, unspecified: Secondary | ICD-10-CM | POA: Diagnosis not present

## 2015-12-21 DIAGNOSIS — Z809 Family history of malignant neoplasm, unspecified: Secondary | ICD-10-CM | POA: Diagnosis not present

## 2015-12-21 DIAGNOSIS — F418 Other specified anxiety disorders: Secondary | ICD-10-CM | POA: Insufficient documentation

## 2015-12-21 DIAGNOSIS — M1990 Unspecified osteoarthritis, unspecified site: Secondary | ICD-10-CM | POA: Diagnosis not present

## 2015-12-21 DIAGNOSIS — Z6841 Body Mass Index (BMI) 40.0 and over, adult: Secondary | ICD-10-CM | POA: Insufficient documentation

## 2015-12-21 DIAGNOSIS — Z7982 Long term (current) use of aspirin: Secondary | ICD-10-CM | POA: Insufficient documentation

## 2015-12-21 DIAGNOSIS — K219 Gastro-esophageal reflux disease without esophagitis: Secondary | ICD-10-CM | POA: Insufficient documentation

## 2015-12-21 HISTORY — PX: MAXILLARY ANTROSTOMY: SHX2003

## 2015-12-21 HISTORY — PX: IMAGE GUIDED SINUS SURGERY: SHX6570

## 2015-12-21 HISTORY — PX: ETHMOIDECTOMY: SHX5197

## 2015-12-21 HISTORY — PX: FRONTAL SINUS EXPLORATION: SHX6591

## 2015-12-21 HISTORY — PX: HYPERPLASIA TISSUE EXCISION: SHX5201

## 2015-12-21 SURGERY — SINUS SURGERY, WITH IMAGING GUIDANCE
Anesthesia: General | Laterality: Left

## 2015-12-21 MED ORDER — PROPOFOL 10 MG/ML IV BOLUS
INTRAVENOUS | Status: DC | PRN
  Administered 2015-12-21: 150 mg via INTRAVENOUS

## 2015-12-21 MED ORDER — PROMETHAZINE HCL 25 MG/ML IJ SOLN: 6.2500 mg | INTRAMUSCULAR | Status: DC | PRN

## 2015-12-21 MED ORDER — OXYCODONE HCL 5 MG PO TABS
5.0000 mg | ORAL_TABLET | Freq: Once | ORAL | Status: DC | PRN
Start: 2015-12-21 — End: 2015-12-21

## 2015-12-21 MED ORDER — CEFAZOLIN SODIUM-DEXTROSE 2-4 GM/100ML-% IV SOLN
INTRAVENOUS | Status: AC
  Filled 2015-12-21: qty 100

## 2015-12-21 MED ORDER — FENTANYL CITRATE (PF) 100 MCG/2ML IJ SOLN
INTRAMUSCULAR | Status: AC
  Administered 2015-12-21: 25 ug via INTRAVENOUS
  Filled 2015-12-21: qty 2

## 2015-12-21 MED ORDER — PHENYLEPHRINE HCL 10 MG/ML IJ SOLN
INTRAMUSCULAR | Status: DC | PRN
  Administered 2015-12-21: 100 ug via INTRAVENOUS
  Administered 2015-12-21: 200 ug via INTRAVENOUS
  Administered 2015-12-21: 100 ug via INTRAVENOUS

## 2015-12-21 MED ORDER — GLYCOPYRROLATE 0.2 MG/ML IJ SOLN
INTRAMUSCULAR | Status: DC | PRN
  Administered 2015-12-21: .8 mg via INTRAVENOUS

## 2015-12-21 MED ORDER — SUCCINYLCHOLINE CHLORIDE 20 MG/ML IJ SOLN
INTRAMUSCULAR | Status: DC | PRN
  Administered 2015-12-21: 140 mg via INTRAVENOUS

## 2015-12-21 MED ORDER — FENTANYL CITRATE (PF) 100 MCG/2ML IJ SOLN
25.0000 ug | INTRAMUSCULAR | Status: DC | PRN
  Administered 2015-12-21 (×3): 25 ug via INTRAVENOUS

## 2015-12-21 MED ORDER — NEOSTIGMINE METHYLSULFATE 10 MG/10ML IV SOLN
INTRAVENOUS | Status: DC | PRN
  Administered 2015-12-21: 5 mg via INTRAVENOUS

## 2015-12-21 MED ORDER — LACTATED RINGERS IV SOLN
INTRAVENOUS | Status: DC
  Administered 2015-12-21: 07:00:00 via INTRAVENOUS

## 2015-12-21 MED ORDER — MEPERIDINE HCL 25 MG/ML IJ SOLN: 6.2500 mg | INTRAMUSCULAR | Status: DC | PRN

## 2015-12-21 MED ORDER — OXYMETAZOLINE HCL 0.05 % NA SOLN
2.0000 | Freq: Once | NASAL | Status: AC
  Administered 2015-12-21: 2 via NASAL

## 2015-12-21 MED ORDER — LIDOCAINE-EPINEPHRINE (PF) 1 %-1:200000 IJ SOLN
INTRAMUSCULAR | Status: AC
  Filled 2015-12-21: qty 30

## 2015-12-21 MED ORDER — CEFAZOLIN SODIUM-DEXTROSE 2-4 GM/100ML-% IV SOLN
2.0000 g | Freq: Once | INTRAVENOUS | Status: AC
  Administered 2015-12-21: 2 g via INTRAVENOUS

## 2015-12-21 MED ORDER — ESMOLOL HCL 100 MG/10ML IV SOLN
INTRAVENOUS | Status: DC | PRN
  Administered 2015-12-21: 50 mg via INTRAVENOUS

## 2015-12-21 MED ORDER — OXYCODONE HCL 5 MG/5ML PO SOLN: 5.0000 mg | Freq: Once | ORAL | Status: DC | PRN

## 2015-12-21 MED ORDER — LIDOCAINE HCL (CARDIAC) 20 MG/ML IV SOLN
INTRAVENOUS | Status: DC | PRN
  Administered 2015-12-21: 60 mg via INTRAVENOUS

## 2015-12-21 MED ORDER — LIDOCAINE-EPINEPHRINE (PF) 1 %-1:200000 IJ SOLN
INTRAMUSCULAR | Status: DC | PRN
  Administered 2015-12-21: 2 mL

## 2015-12-21 MED ORDER — FENTANYL CITRATE (PF) 100 MCG/2ML IJ SOLN
INTRAMUSCULAR | Status: DC | PRN
  Administered 2015-12-21 (×2): 50 ug via INTRAVENOUS
  Administered 2015-12-21: 100 ug via INTRAVENOUS
  Administered 2015-12-21: 25 ug via INTRAVENOUS

## 2015-12-21 MED ORDER — DEXAMETHASONE SODIUM PHOSPHATE 10 MG/ML IJ SOLN
INTRAMUSCULAR | Status: DC | PRN
  Administered 2015-12-21: 10 mg via INTRAVENOUS

## 2015-12-21 MED ORDER — OXYMETAZOLINE HCL 0.05 % NA SOLN
NASAL | Status: AC
  Filled 2015-12-21: qty 15

## 2015-12-21 MED ORDER — ROCURONIUM BROMIDE 100 MG/10ML IV SOLN
INTRAVENOUS | Status: DC | PRN
  Administered 2015-12-21 (×2): 10 mg via INTRAVENOUS
  Administered 2015-12-21: 25 mg via INTRAVENOUS
  Administered 2015-12-21: 10 mg via INTRAVENOUS
  Administered 2015-12-21: 5 mg via INTRAVENOUS

## 2015-12-21 MED ORDER — ONDANSETRON HCL 4 MG/2ML IJ SOLN
INTRAMUSCULAR | Status: DC | PRN
  Administered 2015-12-21: 4 mg via INTRAVENOUS

## 2015-12-21 MED ORDER — PHENYLEPHRINE HCL 10 % OP SOLN
Freq: Once | OPHTHALMIC | Status: AC
  Administered 2015-12-21: 10 mL via TOPICAL
  Filled 2015-12-21: qty 10

## 2015-12-21 SURGICAL SUPPLY — 47 items
BATTERY INSTRU NAVIGATION (MISCELLANEOUS) ×9 IMPLANT
CANISTER SUCT 1200ML W/VALVE (MISCELLANEOUS) ×3 IMPLANT
CANISTER SUCT 3000ML (MISCELLANEOUS) ×3 IMPLANT
CNTNR SPEC 2.5X3XGRAD LEK (MISCELLANEOUS) ×2
COAG SUCT 10F 3.5MM HAND CTRL (MISCELLANEOUS) IMPLANT
COAGULATOR SUCT 8FR VV (MISCELLANEOUS) ×3 IMPLANT
CONT SPEC 4OZ STER OR WHT (MISCELLANEOUS) ×1
CONTAINER SPEC 2.5X3XGRAD LEK (MISCELLANEOUS) ×2 IMPLANT
CUP MEDICINE 2OZ PLAST GRAD ST (MISCELLANEOUS) ×3 IMPLANT
DEPRESSOR TONGUE BLADE STERILE (MISCELLANEOUS) ×3 IMPLANT
DRAIN PENROSE 1/4X12 LTX (DRAIN) IMPLANT
DRAPE MAG INST 16X20 L/F (DRAPES) ×3 IMPLANT
DRESSING NASL FOAM PST OP SINU (MISCELLANEOUS) IMPLANT
DRSG NASAL FOAM POST OP SINU (MISCELLANEOUS)
ELECT REM PT RETURN 9FT ADLT (ELECTROSURGICAL) ×3
ELECTRODE REM PT RTRN 9FT ADLT (ELECTROSURGICAL) ×2 IMPLANT
GLOVE BIO SURGEON STRL SZ 6.5 (GLOVE) ×3 IMPLANT
GLOVE PROTEXIS LATEX SZ 7.5 (GLOVE) ×3 IMPLANT
GOWN STRL REUS W/ TWL LRG LVL3 (GOWN DISPOSABLE) ×4 IMPLANT
GOWN STRL REUS W/TWL LRG LVL3 (GOWN DISPOSABLE) ×2
IRRIGATOR 4MM STR (IRRIGATION / IRRIGATOR) ×3 IMPLANT
IV NS 500ML (IV SOLUTION) ×1
IV NS 500ML BAXH (IV SOLUTION) ×2 IMPLANT
LABEL OR SOLS (LABEL) IMPLANT
NAVIGATION MASK REG  ST (MISCELLANEOUS) ×3 IMPLANT
NEEDLE SPNL 25GX3.5 QUINCKE BL (NEEDLE) ×3 IMPLANT
NS IRRIG 500ML POUR BTL (IV SOLUTION) ×3 IMPLANT
PACK HEAD/NECK (MISCELLANEOUS) ×3 IMPLANT
PACKING NASAL EPIS 4X2.4 XEROG (MISCELLANEOUS) ×6 IMPLANT
PATTIES SURGICAL .5 X3 (DISPOSABLE) ×3 IMPLANT
SET HANDPIECE IRR DIEGO (MISCELLANEOUS) ×3 IMPLANT
SOL ANTI-FOG 6CC FOG-OUT (MISCELLANEOUS) ×2 IMPLANT
SOL FOG-OUT ANTI-FOG 6CC (MISCELLANEOUS) ×1
SPOGE SURGIFLO 8M (HEMOSTASIS)
SPONGE NEURO XRAY DETECT 1X3 (DISPOSABLE) ×3 IMPLANT
SPONGE SURGIFLO 8M (HEMOSTASIS) IMPLANT
SUT CHROMIC 3 0 SH 27 (SUTURE) ×3 IMPLANT
SUT ETHILON 4-0 (SUTURE) ×1
SUT ETHILON 4-0 FS2 18XMFL BLK (SUTURE) ×2
SUT PLAIN GUT 4-0 (SUTURE) ×3 IMPLANT
SUT VICRYL+ 4-0 18IN PS-4 (SUTURE) ×3 IMPLANT
SUTURE ETHLN 4-0 FS2 18XMF BLK (SUTURE) ×2 IMPLANT
SWAB CULTURE AMIES ANAERIB BLU (MISCELLANEOUS) IMPLANT
SYR 20CC LL (SYRINGE) ×3 IMPLANT
SYR 3ML LL SCALE MARK (SYRINGE) ×3 IMPLANT
SYRINGE 10CC LL (SYRINGE) ×3 IMPLANT
WATER STERILE IRR 1000ML POUR (IV SOLUTION) IMPLANT

## 2015-12-21 NOTE — H&P (Signed)
  H&P has been reviewed and no changes necessary. To be downloaded later. 

## 2015-12-21 NOTE — Transfer of Care (Signed)
Immediate Anesthesia Transfer of Care Note  Patient: Carrie Whitney  Procedure(s) Performed: Procedure(s): IMAGE GUIDED SINUS SURGERY (Bilateral) MAXILLARY ANTROSTOMY (Left) FRONTAL SINUSOTOMY (Bilateral) ETHMOIDECTOMY (Bilateral) TISSUE EXCISION (Left)  Patient Location: PACU  Anesthesia Type:General  Level of Consciousness: sedated and responds to stimulation  Airway & Oxygen Therapy: Patient Spontanous Breathing and Patient connected to face mask oxygen  Post-op Assessment: Report given to RN and Post -op Vital signs reviewed and stable  Post vital signs: Reviewed and stable  Last Vitals:  Filed Vitals:   12/21/15 0606 12/21/15 0959  BP: 146/57 139/93  Pulse: 83 91  Temp: 37 C   Resp: 16 19    Last Pain:  Filed Vitals:   12/21/15 0959  PainSc: 3          Complications: No apparent anesthesia complications

## 2015-12-21 NOTE — Progress Notes (Signed)
Lidocaine topical solution from pharmacy sent to OR with patient

## 2015-12-21 NOTE — Discharge Instructions (Signed)
AMBULATORY SURGERY  DISCHARGE INSTRUCTIONS   1) The drugs that you were given will stay in your system until tomorrow so for the next 24 hours you should not:  A) Drive an automobile B) Make any legal decisions C) Drink any alcoholic beverage   2) You may resume regular meals tomorrow.  Today it is better to start with liquids and gradually work up to solid foods.  You may eat anything you prefer, but it is better to start with liquids, then soup and crackers, and gradually work up to solid foods.   3) Please notify your doctor immediately if you have any unusual bleeding, trouble breathing, redness and pain at the surgery site, drainage, fever, or pain not relieved by medication.    Please contact your physician with any problems or Same Day Surgery at [phone], Monday through Friday 6 am to 4 pm, or [place] at [facility] number at [phone].  Neilmed nasal rinse 7-8 times/day, every 2-3 hours until seen by Dr Kathyrn Sheriff next week.

## 2015-12-21 NOTE — Op Note (Signed)
12/21/2015  9:39 AM    Elby Beck  ID:[id number]   Pre-Op Dx:  Chronic bilateral frontal sinusitis, chronic bilateral maxillary sinusitis, chronic bilateral ethmoid sinusitis  Post-op Dx: Same  Proc: Bilateral endoscopic total ethmoidectomy, bilateral endoscopic frontal sinusotomy, bilateral endoscopic maxillary antrostomy revisions , use of image guided system  Surg:  [facility] [facility]  Anes:  GOT  EBL:  150 mL  Comp:  None  Findings:  Very inflamed tissues throughout the ethmoid sinuses bilaterally. There was a retained bridge of tissue at the left maxillary antral opening and some tissue at the right maxillary antral opening as well.  Procedure: The patient was brought to the operating room placed in a supine position. She was given general anesthesia by oral endotracheal intubation. The nose was prepped with cottonoid pledgets soaked in phenylephrine and Xylocaine. The image guided system was brought in and the CT scan was downloaded from the disc. The template was applied to the face and then the template was registered to the system. There was 0.5 mm of variance. The suction instruments were registered the system and checked for alignment. This appeared to be perfect alignment. The patient was prepped and draped sterile fashion. The cotton pledgets removed and the 0 scope was used to visualize both sides to evaluate the anatomy.  The left side was addressed first. The 70 scope was used to visualize the left maxillary antrum and you could see the small natural ostium was not included in the antrostomy. This left recycling. The natural ostium was opened and included now in the antrum to widen this whole area using the 90 up-biting forceps as well as the [place]. There is retrained uncinate process that was trimmed and removed. The area was inflamed and tended to ooze. Electrocautery was used along the tissues lateral nasal wall. The 0 scope was used for opening up then  the posterior ethmoid air cells. Mucous membranes were very inflamed here and there was a lot of oozing that occurred. Once the posterior ethmoid air cells were opened with the [facility] and ethmoid forceps. The image guided system was used to make sure that all the air cells were opened and the full sinus was clear. The 30 scope was then used to visualize the middle and anterior ethmoid air cells and these were opened as well using the [facility] microdebrider and the ethmoid forceps. The bone here was fairly thickened from the chronic inflammation and continued to ooze some during the procedure. The anterior ethmoid air cells were opened visualize frontal sinus duct. Some oozing here and cottonoid pledges were placed temporarily for vasoconstriction while the right side was addressed.  The 0 scope was used to visualize the airway. The middle turbinate was infractured and the maxillary antrum was visualized. There is some thickened mucous membranes along the superior medial wall was removed. There appeared to be a small band of tissue at the antrum that was removed as well. There was some retained uncinate process that also was trimmed. The 30 and 70 scopes were used to visualize the maxillary antral opening to make sure it was clear. The 0 scope was used for opening up the posterior ethmoid air cells these were opened and clean make sure that all of bone were entered. The 30 scope was used to visualize the middle and anterior ethmoid air cells and the opening the frontal sinus duct. Again as I cleaned this out there is a lot of oozing here as a cottonoid pledget was placed here  while the other side was then visualized.  Cottonoid pledget was removed left side and the 30 scope with the frontal sinus image guided system was used to find the opening the frontal sinus duct. This was widened using boss frontal sinus through biting forceps make sure there was a good opening into the frontal sinus. The left maxillary  sinus was then revisited and there is a groove in a posterior wall that appeared to be just thickened tissue but was opened. There was a party wall between this and the posterior ethmoid air cell that was removed to allow better drainage prevent inflammation from a residing this area. A [place] luc was not necessary getting to this area and getting it opened well. Cottonoid pledget was placed on the left side while the right side was revisited.  The 30 scope was used to visualize frontal sinus duct. Again the frontal sinus through biting 45 forceps were used for opening up the frontal sinus duct further. A good opening was left ear into the frontal sinus. In a cottonoid pledget was placed here to help control oozing. The left side was then revisited and there was no further significant bleeding. All the sinuses were opened using the image guided system to evaluate. The anterior ethmoids were filled with xerogel then the posterior ethmoids were filled with 0 gel as well. The right side was revisited and there is no further significant bleeding. Xerogel was placed in the anterior ethmoid as well as the posterior ethmoid. The area was clear and there is no further significant bleeding. The inferior turbinates were outfractured. The patient tolerated procedure well and was awakened and taken to the recovery room in satisfactory condition. There were no operative complications.   Dispo:  To PACU to be discharged home  Plan: To rest at home with head of bed elevated. She'll start saline flushes and her nose tomorrow. she is not to blow her nose, but may sniff all she wants. She will call if she has problems but we'll plan to see her in 1 week for follow-up.    Laramie Gelles H  12/21/2015 9:39 AM

## 2015-12-21 NOTE — Progress Notes (Signed)
Dr. Kathyrn Sheriff aware of pt's abx allergies and discussed with patient.   Advises ok to proceed with Ancef.

## 2015-12-21 NOTE — Anesthesia Procedure Notes (Signed)
Procedure Name: Intubation Performed by: Lance Muss Pre-anesthesia Checklist: Patient identified, Patient being monitored, Timeout performed, Emergency Drugs available and Suction available Patient Re-evaluated:Patient Re-evaluated prior to inductionOxygen Delivery Method: Circle system utilized Preoxygenation: Pre-oxygenation with 100% oxygen Intubation Type: IV induction Ventilation: Mask ventilation without difficulty Laryngoscope Size: Mac and 3 Grade View: Grade II Tube type: Oral Rae Tube size: 7.0 mm Number of attempts: 1 Airway Equipment and Method: Stylet and LTA kit utilized Placement Confirmation: ETT inserted through vocal cords under direct vision,  positive ETCO2 and breath sounds checked- equal and bilateral Tube secured with: Tape Dental Injury: Teeth and Oropharynx as per pre-operative assessment  Difficulty Due To: Difficult Airway- due to anterior larynx

## 2015-12-21 NOTE — Anesthesia Postprocedure Evaluation (Signed)
Anesthesia Post Note  Patient: MADDOX BELLEAU  Procedure(s) Performed: Procedure(s) (LRB): IMAGE GUIDED SINUS SURGERY (Bilateral) MAXILLARY ANTROSTOMY (Left) FRONTAL SINUSOTOMY (Bilateral) ETHMOIDECTOMY (Bilateral) TISSUE EXCISION (Left)  Patient location during evaluation: PACU Anesthesia Type: General Level of consciousness: awake, awake and alert and oriented Pain management: pain level controlled Vital Signs Assessment: post-procedure vital signs reviewed and stable Respiratory status: spontaneous breathing, nonlabored ventilation and respiratory function stable Cardiovascular status: blood pressure returned to baseline and stable Postop Assessment: no signs of nausea or vomiting Anesthetic complications: no    Last Vitals:  Filed Vitals:   12/21/15 1058 12/21/15 1111  BP: 109/59 128/68  Pulse: 98 89  Temp: 36.9 C 36.8 C  Resp: 29 18    Last Pain:  Filed Vitals:   12/21/15 1114  PainSc: 1                  Elih Mooney

## 2015-12-23 LAB — SURGICAL PATHOLOGY

## 2016-08-18 ENCOUNTER — Other Ambulatory Visit: Payer: Self-pay | Admitting: Internal Medicine

## 2016-08-18 ENCOUNTER — Other Ambulatory Visit: Payer: Self-pay | Admitting: Family Medicine

## 2016-08-18 DIAGNOSIS — Z1231 Encounter for screening mammogram for malignant neoplasm of breast: Secondary | ICD-10-CM

## 2016-10-04 ENCOUNTER — Ambulatory Visit
Admission: RE | Admit: 2016-10-04 | Discharge: 2016-10-04 | Disposition: A | Discharge status: Home / Self Care | Payer: Managed Care, Other (non HMO) | Source: Ambulatory Visit | Attending: Family Medicine | Admitting: Family Medicine

## 2016-10-04 DIAGNOSIS — Z1231 Encounter for screening mammogram for malignant neoplasm of breast: Secondary | ICD-10-CM | POA: Insufficient documentation

## 2017-03-09 ENCOUNTER — Other Ambulatory Visit: Payer: Self-pay | Admitting: Gastroenterology

## 2017-03-09 DIAGNOSIS — R1013 Epigastric pain: Secondary | ICD-10-CM

## 2017-03-24 ENCOUNTER — Ambulatory Visit: Payer: Managed Care, Other (non HMO)

## 2017-03-24 ENCOUNTER — Other Ambulatory Visit: Payer: Managed Care, Other (non HMO)

## 2017-03-27 ENCOUNTER — Ambulatory Visit
Admission: RE | Admit: 2017-03-27 | Discharge: 2017-03-27 | Disposition: A | Discharge status: Home / Self Care | Payer: Managed Care, Other (non HMO) | Source: Ambulatory Visit | Attending: Gastroenterology | Admitting: Gastroenterology

## 2017-03-27 ENCOUNTER — Encounter
Admission: RE | Admit: 2017-03-27 | Discharge: 2017-03-27 | Disposition: A | Discharge status: Home / Self Care | Payer: Managed Care, Other (non HMO) | Source: Ambulatory Visit | Attending: Gastroenterology | Admitting: Gastroenterology

## 2017-03-27 DIAGNOSIS — R1013 Epigastric pain: Secondary | ICD-10-CM

## 2017-04-04 ENCOUNTER — Encounter
Admission: RE | Admit: 2017-04-04 | Discharge: 2017-04-04 | Disposition: A | Discharge status: Home / Self Care | Payer: Managed Care, Other (non HMO) | Source: Ambulatory Visit | Attending: Gastroenterology | Admitting: Gastroenterology

## 2017-04-04 ENCOUNTER — Ambulatory Visit
Admission: RE | Admit: 2017-04-04 | Discharge: 2017-04-04 | Disposition: A | Discharge status: Home / Self Care | Payer: Managed Care, Other (non HMO) | Source: Ambulatory Visit | Attending: Gastroenterology | Admitting: Gastroenterology

## 2017-04-04 DIAGNOSIS — K769 Liver disease, unspecified: Secondary | ICD-10-CM | POA: Diagnosis not present

## 2017-04-04 DIAGNOSIS — R1013 Epigastric pain: Secondary | ICD-10-CM | POA: Insufficient documentation

## 2017-04-04 MED ORDER — TECHNETIUM TC 99M MEBROFENIN IV KIT
5.2850 | PACK | Freq: Once | INTRAVENOUS | Status: AC | PRN
  Administered 2017-04-04: 5.285 via INTRAVENOUS

## 2017-05-09 ENCOUNTER — Encounter: Payer: Self-pay | Admitting: *Deleted

## 2017-05-10 ENCOUNTER — Ambulatory Visit: Payer: Managed Care, Other (non HMO) | Admitting: Anesthesiology

## 2017-05-10 ENCOUNTER — Encounter
Admission: RE | Disposition: A | Discharge status: Home / Self Care | Payer: Self-pay | Source: Ambulatory Visit | Attending: Internal Medicine

## 2017-05-10 ENCOUNTER — Ambulatory Visit
Admission: RE | Admit: 2017-05-10 | Discharge: 2017-05-10 | Disposition: A | Discharge status: Home / Self Care | Payer: Managed Care, Other (non HMO) | Source: Ambulatory Visit | Attending: Internal Medicine | Admitting: Internal Medicine

## 2017-05-10 ENCOUNTER — Encounter: Payer: Self-pay | Admitting: *Deleted

## 2017-05-10 DIAGNOSIS — H8110 Benign paroxysmal vertigo, unspecified ear: Secondary | ICD-10-CM | POA: Diagnosis not present

## 2017-05-10 DIAGNOSIS — Z881 Allergy status to other antibiotic agents status: Secondary | ICD-10-CM | POA: Insufficient documentation

## 2017-05-10 DIAGNOSIS — Z791 Long term (current) use of non-steroidal anti-inflammatories (NSAID): Secondary | ICD-10-CM | POA: Insufficient documentation

## 2017-05-10 DIAGNOSIS — K5730 Diverticulosis of large intestine without perforation or abscess without bleeding: Secondary | ICD-10-CM | POA: Diagnosis not present

## 2017-05-10 DIAGNOSIS — E785 Hyperlipidemia, unspecified: Secondary | ICD-10-CM | POA: Diagnosis not present

## 2017-05-10 DIAGNOSIS — F329 Major depressive disorder, single episode, unspecified: Secondary | ICD-10-CM | POA: Diagnosis not present

## 2017-05-10 DIAGNOSIS — Z86010 Personal history of colonic polyps: Secondary | ICD-10-CM | POA: Diagnosis present

## 2017-05-10 DIAGNOSIS — Z882 Allergy status to sulfonamides status: Secondary | ICD-10-CM | POA: Insufficient documentation

## 2017-05-10 DIAGNOSIS — K317 Polyp of stomach and duodenum: Secondary | ICD-10-CM | POA: Insufficient documentation

## 2017-05-10 DIAGNOSIS — Z1211 Encounter for screening for malignant neoplasm of colon: Secondary | ICD-10-CM | POA: Diagnosis not present

## 2017-05-10 DIAGNOSIS — K640 First degree hemorrhoids: Secondary | ICD-10-CM | POA: Diagnosis not present

## 2017-05-10 DIAGNOSIS — I10 Essential (primary) hypertension: Secondary | ICD-10-CM | POA: Diagnosis not present

## 2017-05-10 DIAGNOSIS — Z79899 Other long term (current) drug therapy: Secondary | ICD-10-CM | POA: Diagnosis not present

## 2017-05-10 DIAGNOSIS — Z8719 Personal history of other diseases of the digestive system: Secondary | ICD-10-CM | POA: Diagnosis present

## 2017-05-10 DIAGNOSIS — Z880 Allergy status to penicillin: Secondary | ICD-10-CM | POA: Insufficient documentation

## 2017-05-10 DIAGNOSIS — K219 Gastro-esophageal reflux disease without esophagitis: Secondary | ICD-10-CM | POA: Diagnosis not present

## 2017-05-10 DIAGNOSIS — F419 Anxiety disorder, unspecified: Secondary | ICD-10-CM | POA: Insufficient documentation

## 2017-05-10 DIAGNOSIS — Z7982 Long term (current) use of aspirin: Secondary | ICD-10-CM | POA: Diagnosis not present

## 2017-05-10 DIAGNOSIS — R1310 Dysphagia, unspecified: Secondary | ICD-10-CM | POA: Insufficient documentation

## 2017-05-10 DIAGNOSIS — Z888 Allergy status to other drugs, medicaments and biological substances status: Secondary | ICD-10-CM | POA: Insufficient documentation

## 2017-05-10 DIAGNOSIS — Z87892 Personal history of anaphylaxis: Secondary | ICD-10-CM | POA: Insufficient documentation

## 2017-05-10 HISTORY — PX: COLONOSCOPY WITH PROPOFOL: SHX5780

## 2017-05-10 HISTORY — PX: ESOPHAGOGASTRODUODENOSCOPY (EGD) WITH PROPOFOL: SHX5813

## 2017-05-10 SURGERY — ESOPHAGOGASTRODUODENOSCOPY (EGD) WITH PROPOFOL
Anesthesia: General

## 2017-05-10 MED ORDER — SODIUM CHLORIDE 0.9 % IV SOLN
INTRAVENOUS | Status: DC
  Administered 2017-05-10: 10:00:00 via INTRAVENOUS

## 2017-05-10 MED ORDER — FENTANYL CITRATE (PF) 100 MCG/2ML IJ SOLN
INTRAMUSCULAR | Status: DC | PRN
  Administered 2017-05-10 (×2): 25 ug via INTRAVENOUS
  Administered 2017-05-10: 50 ug via INTRAVENOUS

## 2017-05-10 MED ORDER — MIDAZOLAM HCL 2 MG/2ML IJ SOLN
INTRAMUSCULAR | Status: AC
  Filled 2017-05-10: qty 2

## 2017-05-10 MED ORDER — PROPOFOL 500 MG/50ML IV EMUL
INTRAVENOUS | Status: DC | PRN
  Administered 2017-05-10: 120 ug/kg/min via INTRAVENOUS

## 2017-05-10 MED ORDER — PROPOFOL 500 MG/50ML IV EMUL
INTRAVENOUS | Status: AC
  Filled 2017-05-10: qty 50

## 2017-05-10 MED ORDER — FENTANYL CITRATE (PF) 100 MCG/2ML IJ SOLN
INTRAMUSCULAR | Status: AC
  Filled 2017-05-10: qty 2

## 2017-05-10 MED ORDER — MIDAZOLAM HCL 2 MG/2ML IJ SOLN
INTRAMUSCULAR | Status: DC | PRN
  Administered 2017-05-10: 2 mg via INTRAVENOUS

## 2017-05-10 NOTE — Anesthesia Postprocedure Evaluation (Signed)
Anesthesia Post Note  Patient: [name]  Procedure(s) Performed: ESOPHAGOGASTRODUODENOSCOPY (EGD) WITH PROPOFOL (N/A ) COLONOSCOPY WITH PROPOFOL (N/A )  Patient location during evaluation: Endoscopy Anesthesia Type: General Level of consciousness: awake and alert Pain management: pain level controlled Vital Signs Assessment: post-procedure vital signs reviewed and stable Respiratory status: spontaneous breathing, nonlabored ventilation, respiratory function stable and patient connected to nasal cannula oxygen Cardiovascular status: blood pressure returned to baseline and stable Postop Assessment: no apparent nausea or vomiting Anesthetic complications: no     Last Vitals:  Vitals:   05/10/17 1101 05/10/17 1111  BP: 112/63 (!) 100/51  Pulse: 66 65  Resp: 20 15  Temp:    SpO2: 96% 97%    Last Pain:  Vitals:   05/10/17 1041  TempSrc: Tympanic                 Martha Clan

## 2017-05-10 NOTE — Anesthesia Procedure Notes (Signed)
Performed by: Cook-Martin, Maricarmen Braziel Pre-anesthesia Checklist: Patient identified, Emergency Drugs available, Suction available, Patient being monitored and Timeout performed Patient Re-evaluated:Patient Re-evaluated prior to induction Oxygen Delivery Method: Nasal cannula Preoxygenation: Pre-oxygenation with 100% oxygen Induction Type: IV induction Airway Equipment and Method: Bite block Placement Confirmation: positive ETCO2 and CO2 detector       

## 2017-05-10 NOTE — Op Note (Signed)
[facility] Gastroenterology Patient Name: Carrie Whitney Procedure Date: 05/10/2017 10:03 AM MRN: [id number] Account #: [id number] Date of Birth: 1955-08-08 Admit Type: Outpatient Age: 61 Room: [facility] ENDO ROOM 4 Gender: Female Note Status: Finalized Procedure:            Colonoscopy Indications:          Surveillance: Personal history of adenomatous polyps on                        last colonoscopy > 3 years ago Providers:            Lorie Apley K. Alice Reichert MD, MD Referring MD:         No Local Md, MD (Referring MD) Medicines:            Propofol per Anesthesia Complications:        No immediate complications. Procedure:            Pre-Anesthesia Assessment:                       - The risks and benefits of the procedure and the                        sedation options and risks were discussed with the                        patient. All questions were answered and informed                        consent was obtained.                       - Patient identification and proposed procedure were                        verified prior to the procedure by the nurse. The                        procedure was verified in the procedure room.                       - ASA Grade Assessment: II - A patient with mild                        systemic disease.                       - After reviewing the risks and benefits, the patient                        was deemed in satisfactory condition to undergo the                        procedure.                       After obtaining informed consent, the colonoscope was                        passed under direct vision. Throughout the procedure,  the patient's blood pressure, pulse, and oxygen                        saturations were monitored continuously. The                        Colonoscope was introduced through the anus and                        advanced to the the cecum, identified by appendiceal          orifice and ileocecal valve. The ileocecal valve,                        appendiceal orifice, and rectum were photographed. The                        colonoscopy was performed without difficulty. The                        patient tolerated the procedure well. The quality of                        the bowel preparation was good. Findings:      The perianal and digital rectal examinations were normal.      Many small and large-mouthed diverticula were found in the sigmoid colon.      Non-bleeding internal hemorrhoids were found during retroflexion. The       hemorrhoids were Grade I (internal hemorrhoids that do not prolapse).      The exam was otherwise without abnormality on direct and retroflexion       views. Impression:           - Diverticulosis in the sigmoid colon.                       - Non-bleeding internal hemorrhoids.                       - The examination was otherwise normal on direct and                        retroflexion views.                       - No specimens collected. Recommendation:       - Patient has a contact number available for                        emergencies. The signs and symptoms of potential                        delayed complications were discussed with the patient.                        Return to normal activities tomorrow. Written discharge                        instructions were provided to the patient.                       - Resume previous diet.                       -  Continue present medications.                       - Repeat colonoscopy in 5 years for surveillance.                       - Return to GI office PRN.                       - The findings and recommendations were discussed with                        the patient and their family. Procedure Code(s):    --- Professional ---                       T0569, Colorectal cancer screening; colonoscopy on                        individual at high risk Diagnosis Code(s):    ---  Professional ---                       Z86.010, Personal history of colonic polyps                       K64.0, First degree hemorrhoids                       K57.30, Diverticulosis of large intestine without                        perforation or abscess without bleeding CPT copyright 2016 American Medical Association. All rights reserved. The codes documented in this report are preliminary and upon coder review may  be revised to meet current compliance requirements. Efrain Sella MD, MD 05/10/2017 10:38:27 AM This report has been signed electronically. Number of Addenda: 0 Note Initiated On: 05/10/2017 10:03 AM Scope Withdrawal Time: 0 hours 6 minutes 37 seconds  Total Procedure Duration: 0 hours 10 minutes 32 seconds       [facility]

## 2017-05-10 NOTE — Transfer of Care (Signed)
   Immediate Anesthesia Transfer of Care Note  Patient: SALLE BRANDLE  Procedure(s) Performed: ESOPHAGOGASTRODUODENOSCOPY (EGD) WITH PROPOFOL (N/A ) COLONOSCOPY WITH PROPOFOL (N/A )  Patient Location: PACU  Anesthesia Type:General  Level of Consciousness: awake, alert  and sedated  Airway & Oxygen Therapy: Patient Spontanous Breathing and Patient connected to nasal cannula oxygen  Post-op Assessment: Report given to RN and Post -op Vital signs reviewed and stable  Post vital signs: Reviewed and stable  Last Vitals:  Vitals:   05/10/17 0933  BP: (!) 156/73  Pulse: 69  Resp: 16  Temp: (!) 36.1 C  SpO2: 99%    Last Pain:  Vitals:   05/10/17 0933  TempSrc: Tympanic         Complications: No apparent anesthesia complications

## 2017-05-10 NOTE — Anesthesia Preprocedure Evaluation (Signed)
Anesthesia Evaluation  Patient identified by MRN, date of birth, ID band Patient awake    Reviewed: Allergy & Precautions, H&P , NPO status , Patient's Chart, lab work & pertinent test results, reviewed documented beta blocker date and time   History of Anesthesia Complications Negative for: history of anesthetic complications  Airway Mallampati: II  TM Distance: >3 FB Neck ROM: full    Dental  (+) Dental Advidsory Given, Chipped, Teeth Intact   Pulmonary neg shortness of breath, neg sleep apnea, neg COPD, Recent URI , Residual Cough,           Cardiovascular Exercise Tolerance: Good hypertension, (-) angina(-) CAD, (-) Past MI, (-) Cardiac Stents and (-) CABG (-) dysrhythmias (-) Valvular Problems/Murmurs     Neuro/Psych PSYCHIATRIC DISORDERS negative neurological ROS     GI/Hepatic Neg liver ROS, GERD  ,  Endo/Other  neg diabetesMorbid obesity  Renal/GU negative Renal ROS  negative genitourinary   Musculoskeletal   Abdominal   Peds  Hematology negative hematology ROS (+)   Anesthesia Other Findings Past Medical History: No date: Anxiety No date: Arthralgia No date: Arthritis No date: BPPV (benign paroxysmal positional vertigo) No date: Cancer [facility])     Comment:  basal cell No date: Colon polyp No date: Depression     Comment:  anxiety 04/17/2015: Fundic gland polyps of stomach, benign No date: GERD (gastroesophageal reflux disease) No date: Hematuria No date: Hyperlipidemia No date: Hypertension No date: Sinusitis, chronic   Reproductive/Obstetrics negative OB ROS                             Anesthesia Physical Anesthesia Plan  ASA: III  Anesthesia Plan: General   Post-op Pain Management:    Induction: Intravenous  PONV Risk Score and Plan: 3 and Propofol infusion  Airway Management Planned: Nasal Cannula  Additional Equipment:   Intra-op Plan:   Post-operative  Plan:   Informed Consent: I have reviewed the patients History and Physical, chart, labs and discussed the procedure including the risks, benefits and alternatives for the proposed anesthesia with the patient or authorized representative who has indicated his/her understanding and acceptance.   Dental Advisory Given  Plan Discussed with: Anesthesiologist, CRNA and Surgeon  Anesthesia Plan Comments:         Anesthesia Quick Evaluation

## 2017-05-10 NOTE — Interval H&P Note (Signed)
History and Physical Interval Note:  05/10/2017 10:05 AM  Carrie Whitney  has presented today for surgery, with the diagnosis of HX.COLON POLYPS,FAMILY HX.COLON CANCER  The various methods of treatment have been discussed with the patient and family. After consideration of risks, benefits and other options for treatment, the patient has consented to  Procedure(s): ESOPHAGOGASTRODUODENOSCOPY (EGD) WITH PROPOFOL (N/A) COLONOSCOPY WITH PROPOFOL (N/A) as a surgical intervention .  The patient's history has been reviewed, patient examined, no change in status, stable for surgery.  I have reviewed the patient's chart and labs.  Questions were answered to the patient's satisfaction.     [place], [place]

## 2017-05-10 NOTE — H&P (Signed)
Outpatient short stay form Pre-procedure 05/10/2017 9:57 AM Carrie Whitney K. Alice Reichert, M.D.  Primary Physician: Arrie Aran, D.O.  Reason for visit:  GERD, Hx gastric polyps, Hx adenomatous colon polyps.  History of present illness:  Patient with hx of well controlled GERD presents for hx of gastric polyps. Patient has also a hx of colon polyps. Denies abdominal pain, rectal bleeding.     Current Facility-Administered Medications:  .  0.9 %  sodium chloride infusion, , Intravenous, Continuous, [place], Benay Pike, MD, Last Rate: 20 mL/hr at 05/10/17 8546  Medications Prior to Admission  Medication Sig Dispense Refill Last Dose  . aspirin EC 81 MG tablet Take 81 mg by mouth daily.   05/04/2017  . atorvastatin (LIPITOR) 40 MG tablet Take 40 mg by mouth every morning.    05/09/2017 at Unknown time  . Calcium Carb-Cholecalciferol (CALCIUM CARBONATE-VITAMIN D3 PO) Take by mouth.   Past Month at Unknown time  . DEXILANT 60 MG capsule Take 60 mg by mouth every morning.   1 05/09/2017 at Unknown time  . diclofenac (VOLTAREN) 50 MG EC tablet Take 50 mg by mouth 2 (two) times daily.   Past Week at Unknown time  . enalapril (VASOTEC) 5 MG tablet Take 5 mg by mouth every morning.   0 05/09/2017 at Unknown time  . furosemide (LASIX) 20 MG tablet Take 20 mg by mouth daily.   05/09/2017 at Unknown time  . nebivolol (BYSTOLIC) 5 MG tablet Take 5 mg by mouth every morning.    05/09/2017 at Unknown time  . acetaminophen (TYLENOL) 325 MG tablet Take 650 mg by mouth every 6 (six) hours as needed.   12/19/2015 at pm  . venlafaxine XR (EFFEXOR XR) 150 MG 24 hr capsule Take 150 mg by mouth daily with breakfast.    12/21/2015 at 0630     Allergies  Allergen Reactions  . Penicillins Anaphylaxis    Has patient had a PCN reaction causing immediate rash, facial/tongue/throat swelling, SOB or lightheadedness with hypotension: Yes Has patient had a PCN reaction causing severe rash involving mucus membranes or skin necrosis:  Yes Has patient had a PCN reaction that required hospitalization No Has patient had a PCN reaction occurring within the last 10 years: Yes If all of the above answers are "NO", then may proceed with Cephalosporin use.   . Amlodipine Other (See Comments)    Stiff joints  . Augmentin [Amoxicillin-Pot Clavulanate] Diarrhea  . Lisinopril Other (See Comments)    Stiff joints  . Sulfa Antibiotics Other (See Comments)    Hypotention  . Valsartan Other (See Comments)    Stiff joints     Past Medical History:  Diagnosis Date  . Anxiety   . Arthralgia   . Arthritis   . BPPV (benign paroxysmal positional vertigo)   . Cancer ([place])    basal cell  . Colon polyp   . Depression    anxiety  . Fundic gland polyps of stomach, benign 04/17/2015  . GERD (gastroesophageal reflux disease)   . Hematuria   . Hyperlipidemia   . Hypertension   . Sinusitis, chronic     Review of systems:      Physical Exam  General appearance: alert, cooperative and appears stated age Resp: clear to auscultation bilaterally Cardio: regular rate and rhythm, S1, S2 normal, no murmur, click, rub or gallop GI: soft, non-tender; bowel sounds normal; no masses,  no organomegaly Extremities: extremities normal, atraumatic, no cyanosis or edema     Planned procedures:  EGD and colonoscopy. The patient understands the nature of the planned procedure, indications, risks, alternatives and potential complications including but not limited to bleeding, infection, perforation, damage to internal organs and possible oversedation/side effects from anesthesia. The patient agrees and gives consent to proceed.  Please refer to procedure notes for findings, recommendations and patient disposition/instructions.    Carrie Whitney K. Alice Reichert, M.D. Gastroenterology 05/10/2017  9:57 AM

## 2017-05-10 NOTE — Op Note (Signed)
[facility] Gastroenterology Patient Name: Carrie Whitney Procedure Date: 05/10/2017 10:04 AM MRN: [id number] Account #: [id number] Date of Birth: 1955-07-04 Admit Type: Outpatient Age: 61 Room: [facility] ENDO ROOM 4 Gender: Female Note Status: Finalized Procedure:            Upper GI endoscopy Indications:          Esophageal reflux, Follow-up of gastric polyps Providers:            Benay Pike. Alice Reichert MD, MD Referring MD:         No Local Md, MD (Referring MD) Medicines:            Propofol per Anesthesia Complications:        No immediate complications. Procedure:            Pre-Anesthesia Assessment:                       - The risks and benefits of the procedure and the                        sedation options and risks were discussed with the                        patient. All questions were answered and informed                        consent was obtained.                       - Patient identification and proposed procedure were                        verified prior to the procedure by the nurse. The                        procedure was verified in the procedure room.                       - ASA Grade Assessment: II - A patient with mild                        systemic disease.                       - After reviewing the risks and benefits, the patient                        was deemed in satisfactory condition to undergo the                        procedure.                       After obtaining informed consent, the endoscope was                        passed under direct vision. Throughout the procedure,                        the patient's blood pressure, pulse, and oxygen  saturations were monitored continuously. The Endoscope                        was introduced through the mouth, and advanced to the                        third part of duodenum. The upper GI endoscopy was                        accomplished without difficulty. The  patient tolerated                        the procedure well. Findings:      No endoscopic abnormality was evident in the esophagus to explain the       patient's complaint of dysphagia.      Multiple 3 to 13 mm semi-sessile polyps with no bleeding and no stigmata       of recent bleeding were found in the gastric body, on the greater       curvature of the stomach and in the gastric antrum. Biopsies were taken       with a cold forceps for histology.      The examined duodenum was normal. Impression:           - No endoscopic esophageal abnormality to explain                        patient's dysphagia.                       - Multiple gastric polyps. Biopsied.                       - Normal examined duodenum. Recommendation:       - Await pathology results.                       - See the other procedure note for documentation of                        additional recommendations. Procedure Code(s):    --- Professional ---                       [phone], Esophagogastroduodenoscopy, flexible, transoral;                        with biopsy, single or multiple Diagnosis Code(s):    --- Professional ---                       R13.10, Dysphagia, unspecified                       K31.7, Polyp of stomach and duodenum                       K21.9, Gastro-esophageal reflux disease without                        esophagitis CPT copyright 2016 American Medical Association. All rights reserved. The codes documented in this report are preliminary and upon coder review may  be revised to meet current compliance requirements. Efrain Sella MD,  MD 05/10/2017 10:23:41 AM This report has been signed electronically. Number of Addenda: 0 Note Initiated On: 05/10/2017 10:04 AM      [facility]

## 2017-05-10 NOTE — Anesthesia Post-op Follow-up Note (Signed)
Anesthesia QCDR form completed.        

## 2017-05-11 ENCOUNTER — Encounter: Payer: Self-pay | Admitting: Internal Medicine

## 2017-05-11 LAB — SURGICAL PATHOLOGY

## 2017-06-07 ENCOUNTER — Ambulatory Visit
Admission: RE | Admit: 2017-06-07 | Discharge: 2017-06-07 | Disposition: A | Discharge status: Home / Self Care | Payer: Managed Care, Other (non HMO) | Source: Ambulatory Visit | Attending: Family Medicine | Admitting: Family Medicine

## 2017-06-07 ENCOUNTER — Other Ambulatory Visit: Payer: Self-pay | Admitting: Family Medicine

## 2017-06-07 DIAGNOSIS — R05 Cough: Secondary | ICD-10-CM | POA: Diagnosis present

## 2017-06-07 DIAGNOSIS — R0602 Shortness of breath: Secondary | ICD-10-CM | POA: Diagnosis not present

## 2017-06-07 DIAGNOSIS — R059 Cough, unspecified: Secondary | ICD-10-CM

## 2017-07-06 DIAGNOSIS — I208 Other forms of angina pectoris: Secondary | ICD-10-CM | POA: Diagnosis not present

## 2017-07-06 DIAGNOSIS — I10 Essential (primary) hypertension: Secondary | ICD-10-CM | POA: Diagnosis not present

## 2017-07-06 DIAGNOSIS — E782 Mixed hyperlipidemia: Secondary | ICD-10-CM | POA: Diagnosis not present

## 2017-07-17 IMAGING — RF DG ESOPHAGUS
4 series · 14 of 24 positions shown · non-contrast
Comparison: None.

CLINICAL DATA: 59-year-old female with dysphagia for 1 week. Solids
intermittently get stuck. History of reflux and prior esophageal
dilation. Initial encounter.

EXAM:
ESOPHOGRAM / BARIUM SWALLOW / BARIUM TABLET STUDY
TECHNIQUE: Combined double contrast and single contrast examination performed
using effervescent crystals, thick barium liquid, and thin barium
liquid. The patient was observed with fluoroscopy swallowing a 13 mm
barium sulphate tablet.
FLUOROSCOPY TIME:  Radiation Exposure Index (as provided by the
fluoroscopic device): 1554.12 micro Gy cm2

[Series 1: cp_standard · 0.27mm/px · 9 of 22 slices shown]
[im 1/22]
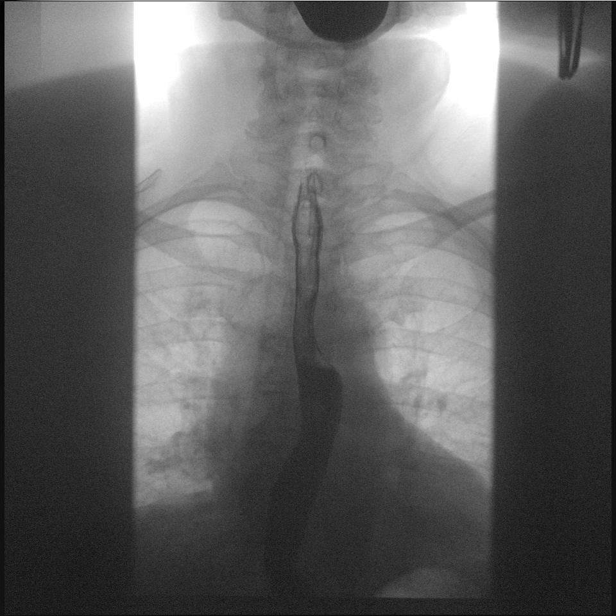
[im 4/22]
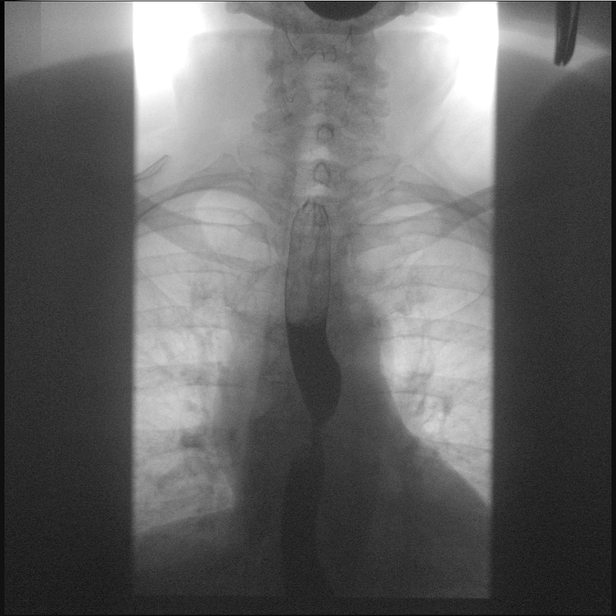
[im 6/22]
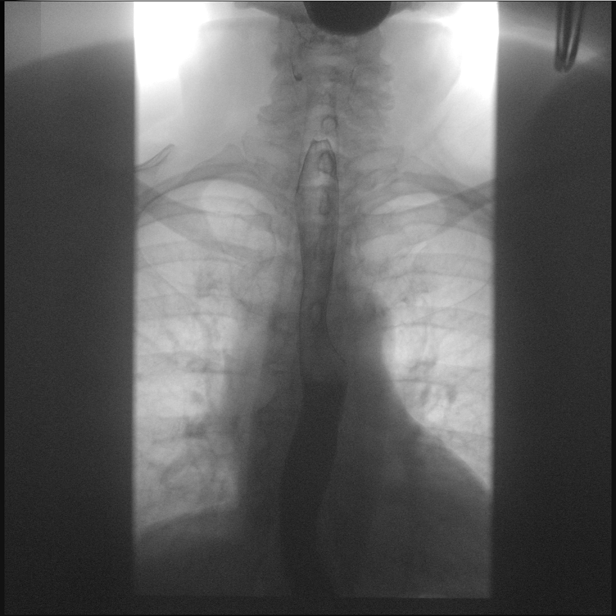
[im 9/22]
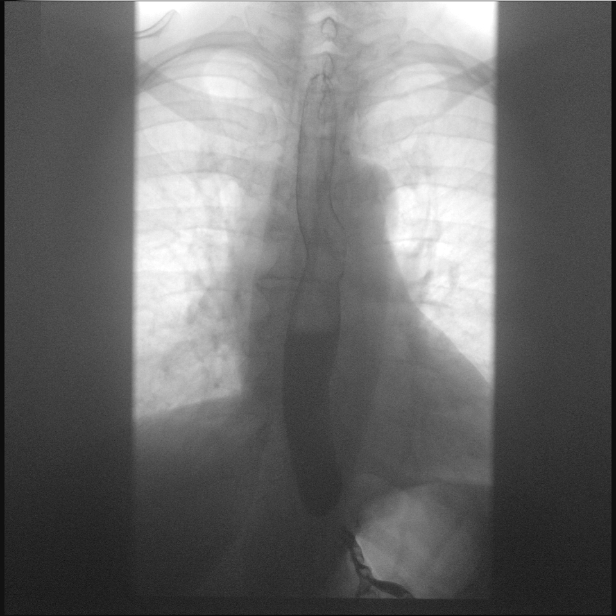
[im 10/22]
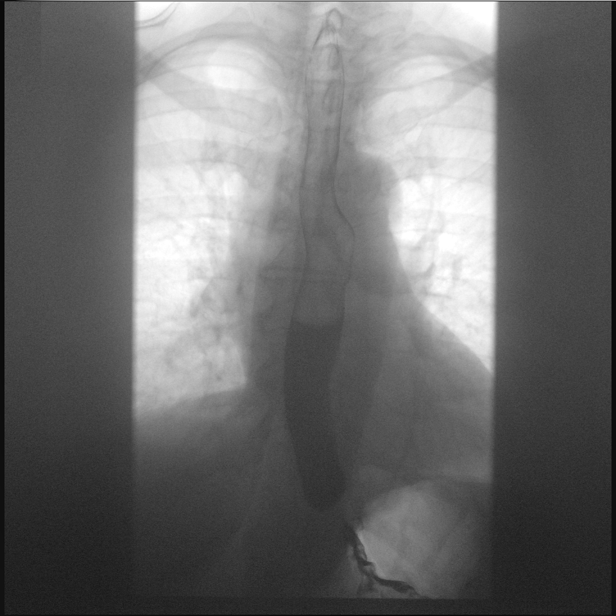
[im 13/22]
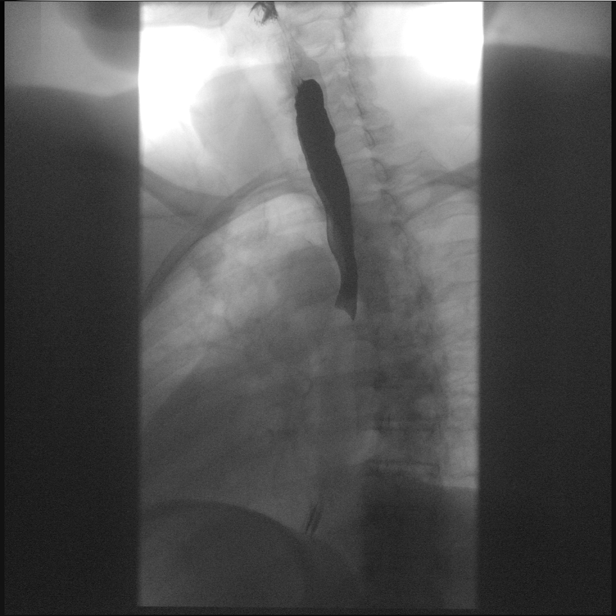
[im 15/22]
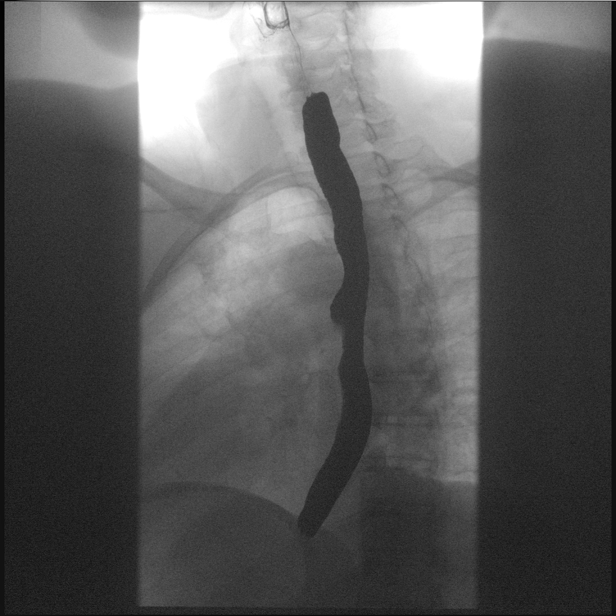
[im 17/22]
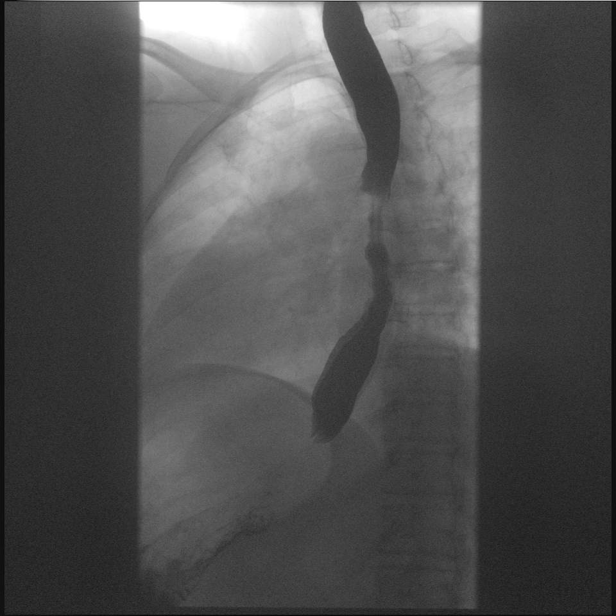
[im 19/22]
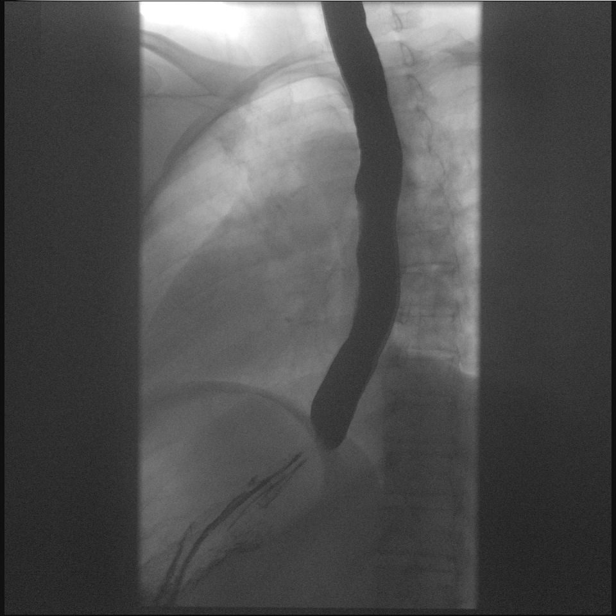

[Series 11: fluoro_barium 2fps_bw · 0.18mm/px · 2 of 4 frames shown (1 of 3)]
[frame 1/4]
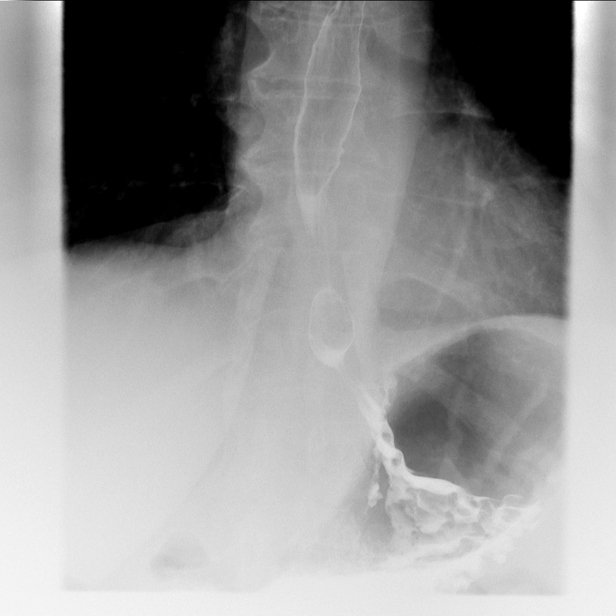
[frame 4/4]
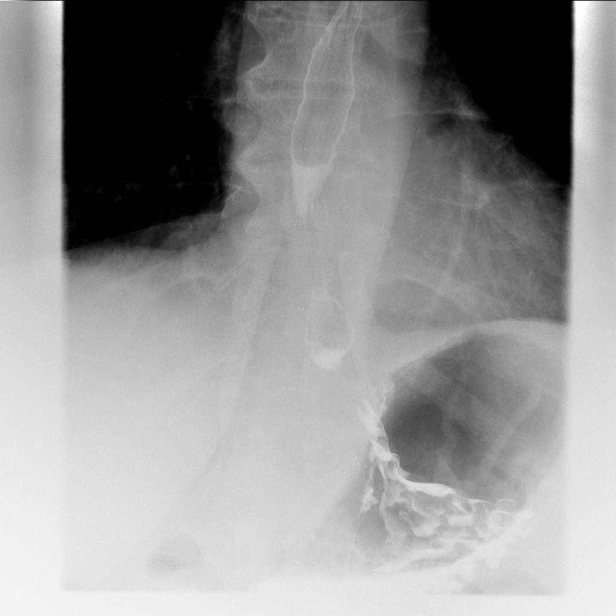

[Series 12: fluoro_barium 2fps_bw · 0.18mm/px · 2 of 15 frames shown (2 of 3)]
[frame 8/15]
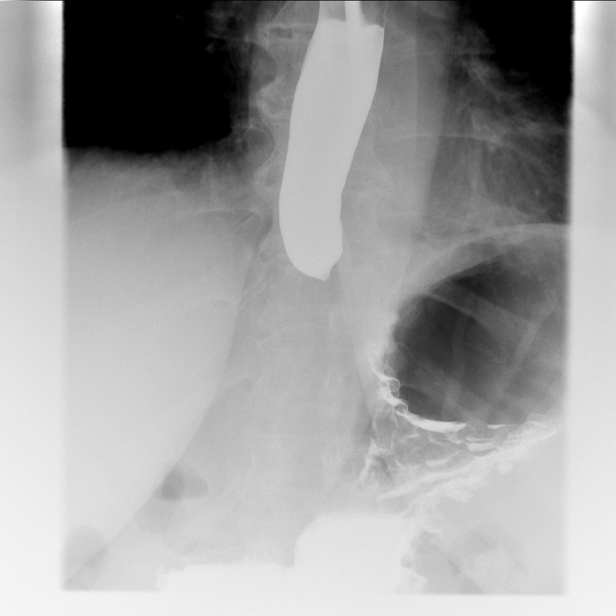
[frame 15/15]
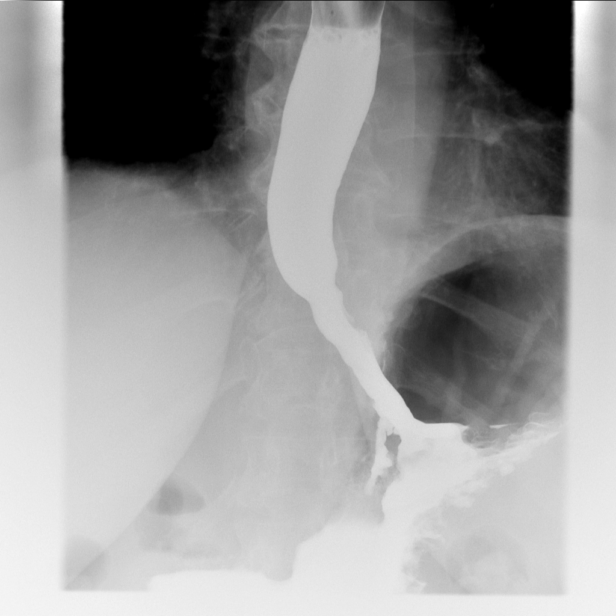

[Series 13: fluoro_barium 2fps_bw · 0.17mm/px · 1 of 17 frames shown (3 of 3)]
[frame 15/17]
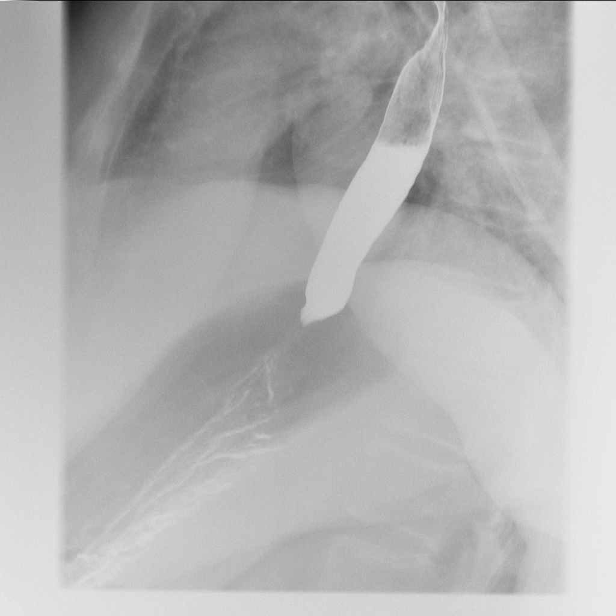

[14 of 24 positions shown; findings below may reference images not displayed]

FINDINGS: Normal primary esophageal stripping wave.

Mild tertiary wave contractions.

Mild smooth narrowing distal esophagus.

Prominent gastroesophageal reflux reaching the upper thoracic
esophagus. No discrete mucosal abnormality.

Patient ingested a 13 mm barium tablet without difficulty
IMPRESSION: Mild smooth narrowing distal esophagus.

Prominent gastroesophageal reflux reaching the upper thoracic
esophagus. No discrete mucosal abnormality. With this degree of
reflux, patient may benefit from endoscopic surveillance.

Tertiary wave contractions were noted throughout the exam suggesting
esophageal irritability.

## 2017-08-09 DIAGNOSIS — K219 Gastro-esophageal reflux disease without esophagitis: Secondary | ICD-10-CM | POA: Diagnosis not present

## 2017-08-09 DIAGNOSIS — I10 Essential (primary) hypertension: Secondary | ICD-10-CM | POA: Diagnosis not present

## 2017-08-09 DIAGNOSIS — R69 Illness, unspecified: Secondary | ICD-10-CM | POA: Diagnosis not present

## 2017-09-25 DIAGNOSIS — M5127 Other intervertebral disc displacement, lumbosacral region: Secondary | ICD-10-CM | POA: Diagnosis not present

## 2017-09-25 DIAGNOSIS — M9903 Segmental and somatic dysfunction of lumbar region: Secondary | ICD-10-CM | POA: Diagnosis not present

## 2017-09-25 DIAGNOSIS — M9904 Segmental and somatic dysfunction of sacral region: Secondary | ICD-10-CM | POA: Diagnosis not present

## 2017-09-25 DIAGNOSIS — M5441 Lumbago with sciatica, right side: Secondary | ICD-10-CM | POA: Diagnosis not present

## 2017-09-29 DIAGNOSIS — M9903 Segmental and somatic dysfunction of lumbar region: Secondary | ICD-10-CM | POA: Diagnosis not present

## 2017-09-29 DIAGNOSIS — M9904 Segmental and somatic dysfunction of sacral region: Secondary | ICD-10-CM | POA: Diagnosis not present

## 2017-09-29 DIAGNOSIS — M5441 Lumbago with sciatica, right side: Secondary | ICD-10-CM | POA: Diagnosis not present

## 2017-09-29 DIAGNOSIS — M5127 Other intervertebral disc displacement, lumbosacral region: Secondary | ICD-10-CM | POA: Diagnosis not present

## 2017-10-03 DIAGNOSIS — M5441 Lumbago with sciatica, right side: Secondary | ICD-10-CM | POA: Diagnosis not present

## 2017-10-03 DIAGNOSIS — M9904 Segmental and somatic dysfunction of sacral region: Secondary | ICD-10-CM | POA: Diagnosis not present

## 2017-10-03 DIAGNOSIS — M9903 Segmental and somatic dysfunction of lumbar region: Secondary | ICD-10-CM | POA: Diagnosis not present

## 2017-10-03 DIAGNOSIS — M5127 Other intervertebral disc displacement, lumbosacral region: Secondary | ICD-10-CM | POA: Diagnosis not present

## 2017-10-12 DIAGNOSIS — I10 Essential (primary) hypertension: Secondary | ICD-10-CM | POA: Diagnosis not present

## 2017-10-12 DIAGNOSIS — E782 Mixed hyperlipidemia: Secondary | ICD-10-CM | POA: Diagnosis not present

## 2017-11-13 DIAGNOSIS — R200 Anesthesia of skin: Secondary | ICD-10-CM | POA: Diagnosis not present

## 2017-11-13 DIAGNOSIS — E782 Mixed hyperlipidemia: Secondary | ICD-10-CM | POA: Diagnosis not present

## 2017-11-13 DIAGNOSIS — Z0000 Encounter for general adult medical examination without abnormal findings: Secondary | ICD-10-CM | POA: Diagnosis not present

## 2017-11-13 DIAGNOSIS — R69 Illness, unspecified: Secondary | ICD-10-CM | POA: Diagnosis not present

## 2017-11-13 DIAGNOSIS — R7303 Prediabetes: Secondary | ICD-10-CM | POA: Diagnosis not present

## 2017-11-13 DIAGNOSIS — I10 Essential (primary) hypertension: Secondary | ICD-10-CM | POA: Diagnosis not present

## 2017-11-20 DIAGNOSIS — R292 Abnormal reflex: Secondary | ICD-10-CM | POA: Diagnosis not present

## 2017-11-20 DIAGNOSIS — R200 Anesthesia of skin: Secondary | ICD-10-CM | POA: Diagnosis not present

## 2017-11-20 DIAGNOSIS — R209 Unspecified disturbances of skin sensation: Secondary | ICD-10-CM | POA: Diagnosis not present

## 2017-11-20 DIAGNOSIS — G8929 Other chronic pain: Secondary | ICD-10-CM | POA: Diagnosis not present

## 2017-11-20 DIAGNOSIS — R51 Headache: Secondary | ICD-10-CM | POA: Diagnosis not present

## 2017-11-20 DIAGNOSIS — R202 Paresthesia of skin: Secondary | ICD-10-CM | POA: Diagnosis not present

## 2017-11-20 DIAGNOSIS — M542 Cervicalgia: Secondary | ICD-10-CM | POA: Diagnosis not present

## 2017-12-01 DIAGNOSIS — G5601 Carpal tunnel syndrome, right upper limb: Secondary | ICD-10-CM | POA: Diagnosis not present

## 2017-12-04 DIAGNOSIS — M1611 Unilateral primary osteoarthritis, right hip: Secondary | ICD-10-CM | POA: Diagnosis not present

## 2017-12-04 DIAGNOSIS — R768 Other specified abnormal immunological findings in serum: Secondary | ICD-10-CM | POA: Diagnosis not present

## 2017-12-04 DIAGNOSIS — R760 Raised antibody titer: Secondary | ICD-10-CM | POA: Diagnosis not present

## 2017-12-04 DIAGNOSIS — M19041 Primary osteoarthritis, right hand: Secondary | ICD-10-CM | POA: Diagnosis not present

## 2017-12-09 DIAGNOSIS — I517 Cardiomegaly: Secondary | ICD-10-CM | POA: Diagnosis not present

## 2017-12-09 DIAGNOSIS — Z5181 Encounter for therapeutic drug level monitoring: Secondary | ICD-10-CM | POA: Diagnosis not present

## 2017-12-09 DIAGNOSIS — Z79899 Other long term (current) drug therapy: Secondary | ICD-10-CM | POA: Diagnosis not present

## 2017-12-09 DIAGNOSIS — R200 Anesthesia of skin: Secondary | ICD-10-CM | POA: Diagnosis not present

## 2017-12-13 DIAGNOSIS — R200 Anesthesia of skin: Secondary | ICD-10-CM | POA: Diagnosis not present

## 2017-12-13 DIAGNOSIS — M5021 Other cervical disc displacement,  high cervical region: Secondary | ICD-10-CM | POA: Diagnosis not present

## 2017-12-13 DIAGNOSIS — M47812 Spondylosis without myelopathy or radiculopathy, cervical region: Secondary | ICD-10-CM | POA: Diagnosis not present

## 2017-12-15 DIAGNOSIS — M47812 Spondylosis without myelopathy or radiculopathy, cervical region: Secondary | ICD-10-CM | POA: Diagnosis not present

## 2017-12-15 DIAGNOSIS — G5611 Other lesions of median nerve, right upper limb: Secondary | ICD-10-CM | POA: Diagnosis not present

## 2017-12-15 DIAGNOSIS — G5631 Lesion of radial nerve, right upper limb: Secondary | ICD-10-CM | POA: Diagnosis not present

## 2017-12-15 DIAGNOSIS — R209 Unspecified disturbances of skin sensation: Secondary | ICD-10-CM | POA: Diagnosis not present

## 2017-12-22 DIAGNOSIS — R768 Other specified abnormal immunological findings in serum: Secondary | ICD-10-CM | POA: Diagnosis not present

## 2018-01-18 DIAGNOSIS — R768 Other specified abnormal immunological findings in serum: Secondary | ICD-10-CM | POA: Diagnosis not present

## 2018-01-18 DIAGNOSIS — R760 Raised antibody titer: Secondary | ICD-10-CM | POA: Diagnosis not present

## 2018-01-18 DIAGNOSIS — M19041 Primary osteoarthritis, right hand: Secondary | ICD-10-CM | POA: Diagnosis not present

## 2018-01-18 DIAGNOSIS — M47816 Spondylosis without myelopathy or radiculopathy, lumbar region: Secondary | ICD-10-CM | POA: Diagnosis not present

## 2018-01-29 DIAGNOSIS — Z85828 Personal history of other malignant neoplasm of skin: Secondary | ICD-10-CM | POA: Diagnosis not present

## 2018-01-29 DIAGNOSIS — L280 Lichen simplex chronicus: Secondary | ICD-10-CM | POA: Diagnosis not present

## 2018-03-01 ENCOUNTER — Other Ambulatory Visit: Payer: Self-pay | Admitting: Pediatrics

## 2018-03-01 ENCOUNTER — Ambulatory Visit
Admission: RE | Admit: 2018-03-01 | Discharge: 2018-03-01 | Disposition: A | Discharge status: Home / Self Care | Payer: Managed Care, Other (non HMO) | Source: Ambulatory Visit | Attending: Pediatrics | Admitting: Pediatrics

## 2018-03-01 DIAGNOSIS — R69 Illness, unspecified: Secondary | ICD-10-CM | POA: Diagnosis not present

## 2018-03-01 DIAGNOSIS — M79604 Pain in right leg: Secondary | ICD-10-CM

## 2018-03-01 DIAGNOSIS — M7989 Other specified soft tissue disorders: Secondary | ICD-10-CM | POA: Insufficient documentation

## 2018-03-01 DIAGNOSIS — M79661 Pain in right lower leg: Secondary | ICD-10-CM | POA: Insufficient documentation

## 2018-03-01 DIAGNOSIS — M47812 Spondylosis without myelopathy or radiculopathy, cervical region: Secondary | ICD-10-CM | POA: Diagnosis not present

## 2018-03-01 DIAGNOSIS — I10 Essential (primary) hypertension: Secondary | ICD-10-CM | POA: Diagnosis not present

## 2018-03-01 DIAGNOSIS — M5416 Radiculopathy, lumbar region: Secondary | ICD-10-CM | POA: Diagnosis not present

## 2018-03-08 ENCOUNTER — Other Ambulatory Visit: Payer: Self-pay | Admitting: Pediatrics

## 2018-03-08 DIAGNOSIS — M25551 Pain in right hip: Secondary | ICD-10-CM | POA: Diagnosis not present

## 2018-03-08 DIAGNOSIS — R202 Paresthesia of skin: Secondary | ICD-10-CM | POA: Diagnosis not present

## 2018-03-08 DIAGNOSIS — R200 Anesthesia of skin: Secondary | ICD-10-CM | POA: Diagnosis not present

## 2018-03-08 DIAGNOSIS — M4807 Spinal stenosis, lumbosacral region: Secondary | ICD-10-CM | POA: Diagnosis not present

## 2018-03-08 DIAGNOSIS — Z1231 Encounter for screening mammogram for malignant neoplasm of breast: Secondary | ICD-10-CM

## 2018-03-22 ENCOUNTER — Ambulatory Visit
Admission: RE | Admit: 2018-03-22 | Discharge: 2018-03-22 | Disposition: A | Discharge status: Home / Self Care | Payer: Managed Care, Other (non HMO) | Source: Ambulatory Visit | Attending: Pediatrics | Admitting: Pediatrics

## 2018-03-22 DIAGNOSIS — Z1231 Encounter for screening mammogram for malignant neoplasm of breast: Secondary | ICD-10-CM

## 2018-03-27 ENCOUNTER — Other Ambulatory Visit: Payer: Self-pay | Admitting: Pediatrics

## 2018-03-27 DIAGNOSIS — N6310 Unspecified lump in the right breast, unspecified quadrant: Secondary | ICD-10-CM

## 2018-03-27 DIAGNOSIS — R928 Other abnormal and inconclusive findings on diagnostic imaging of breast: Secondary | ICD-10-CM

## 2018-03-29 DIAGNOSIS — M5116 Intervertebral disc disorders with radiculopathy, lumbar region: Secondary | ICD-10-CM | POA: Diagnosis not present

## 2018-04-16 ENCOUNTER — Ambulatory Visit
Admission: RE | Admit: 2018-04-16 | Discharge: 2018-04-16 | Disposition: A | Discharge status: Home / Self Care | Payer: 59 | Source: Ambulatory Visit | Attending: Pediatrics | Admitting: Pediatrics

## 2018-04-16 DIAGNOSIS — R928 Other abnormal and inconclusive findings on diagnostic imaging of breast: Secondary | ICD-10-CM | POA: Diagnosis not present

## 2018-04-16 DIAGNOSIS — N6311 Unspecified lump in the right breast, upper outer quadrant: Secondary | ICD-10-CM | POA: Diagnosis not present

## 2018-04-16 DIAGNOSIS — N6310 Unspecified lump in the right breast, unspecified quadrant: Secondary | ICD-10-CM | POA: Diagnosis not present

## 2018-04-16 DIAGNOSIS — R921 Mammographic calcification found on diagnostic imaging of breast: Secondary | ICD-10-CM | POA: Diagnosis not present

## 2018-04-18 ENCOUNTER — Other Ambulatory Visit: Payer: Self-pay | Admitting: Pediatrics

## 2018-04-18 DIAGNOSIS — N6310 Unspecified lump in the right breast, unspecified quadrant: Secondary | ICD-10-CM

## 2018-04-18 DIAGNOSIS — R200 Anesthesia of skin: Secondary | ICD-10-CM | POA: Diagnosis not present

## 2018-04-18 DIAGNOSIS — R928 Other abnormal and inconclusive findings on diagnostic imaging of breast: Secondary | ICD-10-CM

## 2018-04-20 DIAGNOSIS — H43813 Vitreous degeneration, bilateral: Secondary | ICD-10-CM | POA: Diagnosis not present

## 2018-04-24 DIAGNOSIS — R760 Raised antibody titer: Secondary | ICD-10-CM | POA: Diagnosis not present

## 2018-04-25 ENCOUNTER — Ambulatory Visit
Admission: RE | Admit: 2018-04-25 | Discharge: 2018-04-25 | Disposition: A | Discharge status: Home / Self Care | Payer: 59 | Source: Ambulatory Visit | Attending: Pediatrics | Admitting: Pediatrics

## 2018-04-25 DIAGNOSIS — C50919 Malignant neoplasm of unspecified site of unspecified female breast: Secondary | ICD-10-CM

## 2018-04-25 DIAGNOSIS — N6310 Unspecified lump in the right breast, unspecified quadrant: Secondary | ICD-10-CM

## 2018-04-25 DIAGNOSIS — R928 Other abnormal and inconclusive findings on diagnostic imaging of breast: Secondary | ICD-10-CM

## 2018-04-25 DIAGNOSIS — N6311 Unspecified lump in the right breast, upper outer quadrant: Secondary | ICD-10-CM | POA: Diagnosis not present

## 2018-04-25 DIAGNOSIS — R921 Mammographic calcification found on diagnostic imaging of breast: Secondary | ICD-10-CM | POA: Diagnosis not present

## 2018-04-25 HISTORY — PX: BREAST BIOPSY: SHX20

## 2018-04-25 HISTORY — DX: Malignant neoplasm of unspecified site of unspecified female breast: C50.919

## 2018-04-26 ENCOUNTER — Other Ambulatory Visit: Payer: Self-pay | Admitting: Anatomic Pathology & Clinical Pathology

## 2018-04-28 DIAGNOSIS — S73191A Other sprain of right hip, initial encounter: Secondary | ICD-10-CM | POA: Diagnosis not present

## 2018-04-30 ENCOUNTER — Encounter: Payer: Self-pay | Admitting: *Deleted

## 2018-04-30 NOTE — Progress Notes (Signed)
  Oncology Nurse Navigator Documentation  Navigator Location: [facility] (04/30/18 1400)   )Navigator Encounter Type: Introductory phone call (04/30/18 1400)                                                    Time Spent with Patient: 30 (04/30/18 1400)    Patient with new diagnosis of invasive breast cancer.  Called patient to establish navigation services.  Patient states she wants to go to [facility].  I have called her primary care doctor, Barbaraann Boys, MD's office and spoke to [facility].  Informed her of the patient request for referral to [facility]. Informed the office theywould need to make the referral.  Patient notified of my phone call to Dr. Burna Sis office.  She is to call if has any questions or needs.

## 2018-05-01 DIAGNOSIS — N6311 Unspecified lump in the right breast, upper outer quadrant: Secondary | ICD-10-CM | POA: Diagnosis not present

## 2018-05-01 LAB — SURGICAL PATHOLOGY

## 2018-05-08 DIAGNOSIS — Z170 Estrogen receptor positive status [ER+]: Secondary | ICD-10-CM | POA: Diagnosis not present

## 2018-05-08 DIAGNOSIS — Z803 Family history of malignant neoplasm of breast: Secondary | ICD-10-CM | POA: Diagnosis not present

## 2018-05-08 DIAGNOSIS — C50411 Malignant neoplasm of upper-outer quadrant of right female breast: Secondary | ICD-10-CM | POA: Diagnosis not present

## 2018-05-10 DIAGNOSIS — Z8051 Family history of malignant neoplasm of kidney: Secondary | ICD-10-CM | POA: Diagnosis not present

## 2018-05-10 DIAGNOSIS — C50911 Malignant neoplasm of unspecified site of right female breast: Secondary | ICD-10-CM | POA: Diagnosis not present

## 2018-05-10 DIAGNOSIS — C50411 Malignant neoplasm of upper-outer quadrant of right female breast: Secondary | ICD-10-CM | POA: Diagnosis not present

## 2018-05-10 DIAGNOSIS — Z803 Family history of malignant neoplasm of breast: Secondary | ICD-10-CM | POA: Diagnosis not present

## 2018-05-10 DIAGNOSIS — R921 Mammographic calcification found on diagnostic imaging of breast: Secondary | ICD-10-CM | POA: Diagnosis not present

## 2018-05-10 DIAGNOSIS — Z170 Estrogen receptor positive status [ER+]: Secondary | ICD-10-CM | POA: Diagnosis not present

## 2018-05-10 DIAGNOSIS — Z8049 Family history of malignant neoplasm of other genital organs: Secondary | ICD-10-CM | POA: Diagnosis not present

## 2018-05-14 DIAGNOSIS — D0511 Intraductal carcinoma in situ of right breast: Secondary | ICD-10-CM | POA: Diagnosis not present

## 2018-05-14 DIAGNOSIS — Z712 Person consulting for explanation of examination or test findings: Secondary | ICD-10-CM | POA: Diagnosis not present

## 2018-05-14 DIAGNOSIS — C50411 Malignant neoplasm of upper-outer quadrant of right female breast: Secondary | ICD-10-CM | POA: Diagnosis not present

## 2018-05-14 DIAGNOSIS — N62 Hypertrophy of breast: Secondary | ICD-10-CM | POA: Diagnosis not present

## 2018-05-14 DIAGNOSIS — Z170 Estrogen receptor positive status [ER+]: Secondary | ICD-10-CM | POA: Diagnosis not present

## 2018-05-14 DIAGNOSIS — N6489 Other specified disorders of breast: Secondary | ICD-10-CM | POA: Diagnosis not present

## 2018-05-14 DIAGNOSIS — N649 Disorder of breast, unspecified: Secondary | ICD-10-CM | POA: Diagnosis not present

## 2018-05-15 DIAGNOSIS — C50411 Malignant neoplasm of upper-outer quadrant of right female breast: Secondary | ICD-10-CM | POA: Diagnosis not present

## 2018-05-15 DIAGNOSIS — C50911 Malignant neoplasm of unspecified site of right female breast: Secondary | ICD-10-CM | POA: Diagnosis not present

## 2018-05-15 DIAGNOSIS — Z170 Estrogen receptor positive status [ER+]: Secondary | ICD-10-CM | POA: Diagnosis not present

## 2018-05-15 DIAGNOSIS — R921 Mammographic calcification found on diagnostic imaging of breast: Secondary | ICD-10-CM | POA: Diagnosis not present

## 2018-05-16 DIAGNOSIS — N650 Deformity of reconstructed breast: Secondary | ICD-10-CM | POA: Diagnosis not present

## 2018-05-16 DIAGNOSIS — C50411 Malignant neoplasm of upper-outer quadrant of right female breast: Secondary | ICD-10-CM | POA: Diagnosis not present

## 2018-05-16 DIAGNOSIS — Z170 Estrogen receptor positive status [ER+]: Secondary | ICD-10-CM | POA: Diagnosis not present

## 2018-05-16 DIAGNOSIS — C773 Secondary and unspecified malignant neoplasm of axilla and upper limb lymph nodes: Secondary | ICD-10-CM | POA: Diagnosis not present

## 2018-05-16 DIAGNOSIS — Z421 Encounter for breast reconstruction following mastectomy: Secondary | ICD-10-CM | POA: Diagnosis not present

## 2018-05-16 DIAGNOSIS — N651 Disproportion of reconstructed breast: Secondary | ICD-10-CM | POA: Diagnosis not present

## 2018-05-16 DIAGNOSIS — R921 Mammographic calcification found on diagnostic imaging of breast: Secondary | ICD-10-CM | POA: Diagnosis not present

## 2018-05-17 DIAGNOSIS — R921 Mammographic calcification found on diagnostic imaging of breast: Secondary | ICD-10-CM | POA: Diagnosis not present

## 2018-05-17 DIAGNOSIS — Z170 Estrogen receptor positive status [ER+]: Secondary | ICD-10-CM | POA: Diagnosis not present

## 2018-05-17 DIAGNOSIS — C50411 Malignant neoplasm of upper-outer quadrant of right female breast: Secondary | ICD-10-CM | POA: Diagnosis not present

## 2018-05-23 DIAGNOSIS — Z170 Estrogen receptor positive status [ER+]: Secondary | ICD-10-CM | POA: Diagnosis not present

## 2018-05-23 DIAGNOSIS — C50411 Malignant neoplasm of upper-outer quadrant of right female breast: Secondary | ICD-10-CM | POA: Diagnosis not present

## 2018-06-01 NOTE — Addendum Note (Signed)
Encounter addended by: Nona Dell, RT on: 06/01/2018 3:58 PM  Actions taken: Imaging Exam begun

## 2018-06-05 NOTE — Addendum Note (Signed)
Encounter addended by: Nona Dell, RT on: 06/05/2018 2:15 PM  Actions taken: Imaging Exam begun

## 2018-06-13 DIAGNOSIS — C50411 Malignant neoplasm of upper-outer quadrant of right female breast: Secondary | ICD-10-CM | POA: Diagnosis not present

## 2018-06-13 DIAGNOSIS — Z170 Estrogen receptor positive status [ER+]: Secondary | ICD-10-CM | POA: Diagnosis not present

## 2018-06-14 DIAGNOSIS — T8140XA Infection following a procedure, unspecified, initial encounter: Secondary | ICD-10-CM | POA: Diagnosis not present

## 2018-06-15 DIAGNOSIS — N611 Abscess of the breast and nipple: Secondary | ICD-10-CM | POA: Diagnosis not present

## 2018-06-15 DIAGNOSIS — T8189XA Other complications of procedures, not elsewhere classified, initial encounter: Secondary | ICD-10-CM | POA: Diagnosis not present

## 2018-06-20 DIAGNOSIS — C50411 Malignant neoplasm of upper-outer quadrant of right female breast: Secondary | ICD-10-CM | POA: Diagnosis not present

## 2018-06-20 DIAGNOSIS — Z170 Estrogen receptor positive status [ER+]: Secondary | ICD-10-CM | POA: Diagnosis not present

## 2018-06-20 DIAGNOSIS — Z8639 Personal history of other endocrine, nutritional and metabolic disease: Secondary | ICD-10-CM | POA: Diagnosis not present

## 2018-06-20 DIAGNOSIS — D649 Anemia, unspecified: Secondary | ICD-10-CM | POA: Diagnosis not present

## 2018-06-26 DIAGNOSIS — Z170 Estrogen receptor positive status [ER+]: Secondary | ICD-10-CM | POA: Diagnosis not present

## 2018-06-26 DIAGNOSIS — C50411 Malignant neoplasm of upper-outer quadrant of right female breast: Secondary | ICD-10-CM | POA: Diagnosis not present

## 2018-06-26 DIAGNOSIS — Z780 Asymptomatic menopausal state: Secondary | ICD-10-CM | POA: Diagnosis not present

## 2018-07-24 DIAGNOSIS — C50411 Malignant neoplasm of upper-outer quadrant of right female breast: Secondary | ICD-10-CM | POA: Diagnosis not present

## 2019-07-21 ENCOUNTER — Ambulatory Visit: Payer: 59 | Attending: Internal Medicine

## 2019-07-21 DIAGNOSIS — Z23 Encounter for immunization: Secondary | ICD-10-CM | POA: Insufficient documentation

## 2019-07-21 NOTE — Progress Notes (Signed)
   Covid-19 Vaccination Clinic  Name:  DHRITI SOLOMONS    MRN: [id number] DOB: 1955/11/07  07/21/2019  Ms. Sabina was observed post Covid-19 immunization for 15 minutes without incidence. She was provided with Vaccine Information Sheet and instruction to access the V-Safe system.   Ms. Macduff was instructed to call 911 with any severe reactions post vaccine: Marland Kitchen Difficulty breathing  . Swelling of your face and throat  . A fast heartbeat  . A bad rash all over your body  . Dizziness and weakness    Immunizations Administered    Name Date Dose VIS Date Route   Pfizer COVID-19 Vaccine 07/21/2019 10:47 AM 0.3 mL 05/17/2019 Intramuscular   Manufacturer: [place]   Lot: [id number]   [place]: [id number]

## 2019-08-20 ENCOUNTER — Ambulatory Visit: Payer: 59 | Attending: Internal Medicine

## 2019-08-20 DIAGNOSIS — Z23 Encounter for immunization: Secondary | ICD-10-CM

## 2019-08-20 NOTE — Progress Notes (Signed)
   Covid-19 Vaccination Clinic  Name:  Carrie Whitney    MRN: [id number] DOB: 1955-09-23  08/20/2019  Carrie Whitney was observed post Covid-19 immunization for 15 minutes without incident. She was provided with Vaccine Information Sheet and instruction to access the V-Safe system.   Carrie Whitney was instructed to call 911 with any severe reactions post vaccine: Marland Kitchen Difficulty breathing  . Swelling of face and throat  . A fast heartbeat  . A bad rash all over body  . Dizziness and weakness   Immunizations Administered    Name Date Dose VIS Date Route   Pfizer COVID-19 Vaccine 08/20/2019 10:05 AM 0.3 mL 05/17/2019 Intramuscular   Manufacturer: [place]   Lot: [id number]   [place]: [id number]
